# Patient Record
Sex: Male | Born: 1990
Health system: Southern US, Community
[De-identification: ages and names within clinical notes are randomized; demographics above are authoritative.]

## PROBLEM LIST (undated history)

## (undated) DIAGNOSIS — I10 Essential (primary) hypertension: Secondary | ICD-10-CM

## (undated) DIAGNOSIS — J45909 Unspecified asthma, uncomplicated: Secondary | ICD-10-CM

## (undated) HISTORY — PX: KNEE SURGERY: SHX244

---
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
## 2014-08-29 ENCOUNTER — Encounter (HOSPITAL_COMMUNITY): Payer: Self-pay | Admitting: Emergency Medicine

## 2014-08-29 ENCOUNTER — Emergency Department (HOSPITAL_COMMUNITY)
Admission: EM | Admit: 2014-08-29 | Discharge: 2014-08-29 | Disposition: A | Discharge status: Home / Self Care | Payer: 59 | Attending: Emergency Medicine | Admitting: Emergency Medicine

## 2014-08-29 ENCOUNTER — Emergency Department (HOSPITAL_COMMUNITY): Payer: 59

## 2014-08-29 DIAGNOSIS — M25562 Pain in left knee: Secondary | ICD-10-CM

## 2014-08-29 DIAGNOSIS — Z79899 Other long term (current) drug therapy: Secondary | ICD-10-CM | POA: Diagnosis not present

## 2014-08-29 DIAGNOSIS — S8992XA Unspecified injury of left lower leg, initial encounter: Secondary | ICD-10-CM | POA: Diagnosis present

## 2014-08-29 DIAGNOSIS — Y9289 Other specified places as the place of occurrence of the external cause: Secondary | ICD-10-CM | POA: Insufficient documentation

## 2014-08-29 DIAGNOSIS — Z9889 Other specified postprocedural states: Secondary | ICD-10-CM | POA: Insufficient documentation

## 2014-08-29 DIAGNOSIS — W1839XA Other fall on same level, initial encounter: Secondary | ICD-10-CM | POA: Diagnosis not present

## 2014-08-29 DIAGNOSIS — W19XXXA Unspecified fall, initial encounter: Secondary | ICD-10-CM

## 2014-08-29 DIAGNOSIS — Y998 Other external cause status: Secondary | ICD-10-CM | POA: Diagnosis not present

## 2014-08-29 DIAGNOSIS — Y9389 Activity, other specified: Secondary | ICD-10-CM | POA: Insufficient documentation

## 2014-08-29 MED ORDER — HYDROCODONE-ACETAMINOPHEN 5-325 MG PO TABS: 1.0000 | ORAL_TABLET | Freq: Four times a day (QID) | ORAL | Status: DC | PRN

## 2014-08-29 NOTE — ED Provider Notes (Signed)
CSN: [id number]     Arrival date & time 08/29/14  1330 History   First MD Initiated Contact with Patient 08/29/14 1352     This chart was scribed for non-physician practitioner, Raymon Mutton PA-C working with Rolland Porter, MD by Arlan Organ, ED Scribe. This patient was seen in room WTR6/WTR6 and the patient's care was started at 3:29 PM.   Chief Complaint  Patient presents with  . Knee Pain   The history is provided by the patient. No language interpreter was used.    HPI Comments: Cory Warren is a 24 y.o. male who presents to the Emergency Department complaining of constant, moderate  L knee pain with associated mild swelling x 1 day that has progressively worsened. Pain is described as sharp and pressure. Pt states he slipped on the ice yesterday resulting in him planting his foot to the ground and twisting his knee hearing a "pop". No head trauma or LOC at time of fall. Patient reported that he is able to bear small amount weight on the leg, reports that he hobbles when walking. Reported that he has applied ice to the area without any improvement for discomfort. He has also tried OTC Ibuprofen without any relief. Reported that he had a similar episode with his right knee that required surgery. Denied back pain, foot pain, neck pain, or ankle pain, numbness, tingling.  Pt with known allergy to Biaxin. Pt is currently followed by Dr. Kathreen Devoid, M.D. Orthopedist in [place], [place]  No past medical history on file. Past Surgical History  Procedure Laterality Date  . Knee surgery     No family history on file. History  Substance Use Topics  . Smoking status: Never Smoker   . Smokeless tobacco: Not on file  . Alcohol Use: No    Review of Systems  Musculoskeletal: Positive for joint swelling and arthralgias.  Neurological: Negative for weakness and numbness.      Allergies  Biaxin  Home Medications   Prior to Admission medications   Medication Sig Start Date  End Date Taking? Authorizing Provider  amphetamine-dextroamphetamine (ADDERALL) 20 MG tablet Take 20 mg by mouth 2 (two) times daily. 08/18/14  Yes Historical Provider, MD  ibuprofen (ADVIL,MOTRIN) 200 MG tablet Take 400 mg by mouth every 6 (six) hours as needed for moderate pain.   Yes Historical Provider, MD  VENTOLIN HFA 108 (90 BASE) MCG/ACT inhaler Inhale 2 puffs into the lungs every 4 (four) hours as needed for wheezing.  08/19/14  Yes Historical Provider, MD  HYDROcodone-acetaminophen (NORCO/VICODIN) 5-325 MG per tablet Take 1 tablet by mouth every 6 (six) hours as needed for moderate pain or severe pain. 08/29/14   Nakesha Ebrahim, PA-C   Triage Vitals: BP 136/96 mmHg  Pulse 97  Temp(Src) 98.6 F (37 C) (Oral)  Resp 16  SpO2 96%   Physical Exam  Constitutional: He is oriented to person, place, and time. He appears well-developed and well-nourished.  HENT:  Head: Normocephalic and atraumatic.  Eyes: Conjunctivae and EOM are normal. Right eye exhibits no discharge. Left eye exhibits no discharge.  Neck: Normal range of motion. Neck supple. No tracheal deviation present.  Cardiovascular: Normal rate, regular rhythm and normal heart sounds.  Exam reveals no friction rub.   No murmur heard. Pulses:      Radial pulses are 2+ on the right side, and 2+ on the left side.       Dorsalis pedis pulses are 2+ on the right side, and  2+ on the left side.  Pulmonary/Chest: Effort normal and breath sounds normal. No respiratory distress. He has no wheezes. He has no rales.  Abdominal: He exhibits no distension.  Musculoskeletal: He exhibits edema and tenderness.       Left knee: He exhibits decreased range of motion (secondary to pain), swelling and effusion. He exhibits no ecchymosis, no deformity, no laceration and no erythema. Tenderness found. Medial joint line and lateral joint line tenderness noted.       Legs: Moderate swelling identified to left knee circumferentially with negative  ecchymosis, erythema, inflammation, warmth upon palpation, red streaks. Negative findings of cellulitic infection. Pain upon palpation to the anterior aspects and medial lateral aspects of the knee. Appears to have a small joint effusion. Decreased range of motion secondary to pain-patient is able to flex and extend the left knee. Patient able to extend the left hip-negative findings of patellar tendon rupture. Full range of motion to left ankle. Full range of motion to the digits of the left foot without difficulty. Compartments are soft. Patient is able to wiggle toes without difficulty.  Lymphadenopathy:    He has no cervical adenopathy.  Neurological: He is alert and oriented to person, place, and time. No cranial nerve deficit. He exhibits normal muscle tone. Coordination normal.  Cranial nerves III-XII grossly intact Strength 5+/5+ to lower extremities bilaterally with resistance applied, equal distribution noted Strength intact to digits of the foot bilaterally Sensation intact with differentiation to sharp and dull touch DTR of left knee intact  Skin: Skin is warm and dry. No rash noted. No erythema.  Psychiatric: He has a normal mood and affect. His behavior is normal. Thought content normal.  Nursing note and vitals reviewed.   ED Course  Procedures (including critical care time)  DIAGNOSTIC STUDIES: Oxygen Saturation is 96% on RA, adequate by my interpretation.    COORDINATION OF CARE: 3:28 PM-Will order DG knee complete 4 views L. Discussed treatment plan with pt at bedside and pt agreed to plan.     Labs Review Labs Reviewed - No data to display  Imaging Review Dg Knee Complete 4 Views Left  08/29/2014   CLINICAL DATA:  Left knee pain post fall on ice today  EXAM: LEFT KNEE - COMPLETE 4+ VIEW  COMPARISON:  None.  FINDINGS: Four views of the left knee submitted. No acute fracture or subluxation. Small joint effusion. No radiopaque foreign body.  IMPRESSION: No acute fracture  or subluxation.  Small joint effusion.   Electronically Signed   By: Natasha MeadLiviu  Pop M.D.   On: 08/29/2014 14:36     EKG Interpretation None      MDM   Final diagnoses:  Left knee pain  Fall, initial encounter    Medications - No data to display  Filed Vitals:   08/29/14 1343  BP: 136/96  Pulse: 97  Temp: 98.6 F (37 C)  TempSrc: Oral  Resp: 16  SpO2: 96%   I personally performed the services described in this documentation, which was scribed in my presence. The recorded information has been reviewed and is accurate.  Plain film of left knee no acute fracture subluxation identified-small joint effusion noted. Patient presenting to the ED with what appears to be a ligament versus meniscal injury. Moderate swelling to the left knee identified with small joint effusion noted on x-ray. Patient is able to flex and extend the left knee, but does have pain. Negative crepitus upon palpation. Negative focal neurological deficits. Cap refill less  than 3 seconds. Pulses palpable and strong. Sensation intact. Strength intact. Doubt quadriceps tendon rupture. Doubt patellar tendon rupture. Doubt dislocation. Doubt compartment syndrome. Doubt septic joint. Patient stable, afebrile. Patient septic appearing. Patient placed in knee immobilizer with crutches. Discharge patient with pain medications-discussed course, cautions, disposal technique. Referred patient to PCP and orthopedics. Discussed with patient rest, ice, elevate. Discussed with patient to avoid any physical strenuous activity. Discussed with patient to closely monitor symptoms and if symptoms are to worsen or change to report back to the ED - strict return instructions given.  Patient agreed to plan of care, understood, all questions answered.   Raymon Mutton, PA-C 08/29/14 1557  Lyanne Co, MD 08/29/14 2209

## 2014-08-29 NOTE — ED Notes (Signed)
Per pt, states he fell in back yard last night and injured left knee-pain and swelling

## 2014-08-29 NOTE — ED Notes (Signed)
Ortho tech at bedside 

## 2014-08-29 NOTE — Discharge Instructions (Signed)
Please call your doctor for a followup appointment within 24-48 hours. When you talk to your doctor please let them know that you were seen in the emergency department and have them acquire all of your records so that they can discuss the findings with you and formulate a treatment plan to fully care for your new and ongoing problems. Please rest, ice, elevate-toes above nose Please keep knee immobilizer on at all times Please use crutches Please follow-up with orthopedics Please take medications as prescribed - while on pain medications there is to be no drinking alcohol, driving, operating any heavy machinery. If extra please dispose in a proper manner. Please do not take any extra Tylenol with this medication for this can lead to Tylenol overdose and liver issues.  Please continue to monitor symptoms closely and if symptoms are to worsen or change (fever greater than 101, chills, sweating, nausea, vomiting, chest pain, shortness of breathe, difficulty breathing, weakness, numbness, tingling, worsening or changes to pain pattern, increased swelling, fall, injury, red streaks, loss of sensation) please report back to the Emergency Department immediately.   Knee Sprain A knee sprain is a tear in one of the strong, fibrous tissues that connect the bones (ligaments) in your knee. The severity of the sprain depends on how much of the ligament is torn. The tear can be either partial or complete. CAUSES  Often, sprains are a result of a fall or injury. The force of the impact causes the fibers of your ligament to stretch too much. This excess tension causes the fibers of your ligament to tear. SIGNS AND SYMPTOMS  You may have some loss of motion in your knee. Other symptoms include:  Bruising.  Pain in the knee area.  Tenderness of the knee to the touch.  Swelling. DIAGNOSIS  To diagnose a knee sprain, your health care provider will physically examine your knee. Your health care provider may also  suggest an X-ray exam of your knee to make sure no bones are broken. TREATMENT  If your ligament is only partially torn, treatment usually involves keeping the knee in a fixed position (immobilization) or bracing your knee for activities that require movement for several weeks. To do this, your health care provider will apply a bandage, cast, or splint to keep your knee from moving and to support your knee during movement until it heals. For a partially torn ligament, the healing process usually takes 4-6 weeks. If your ligament is completely torn, depending on which ligament it is, you may need surgery to reconnect the ligament to the bone or reconstruct it. After surgery, a cast or splint may be applied and will need to stay on your knee for 4-6 weeks while your ligament heals. HOME CARE INSTRUCTIONS  Keep your injured knee elevated to decrease swelling.  To ease pain and swelling, apply ice to the injured area:  Put ice in a plastic bag.  Place a towel between your skin and the bag.  Leave the ice on for 20 minutes, 2-3 times a day.  Only take medicine for pain as directed by your health care provider.  Do not leave your knee unprotected until pain and stiffness go away (usually 4-6 weeks).  If you have a cast or splint, do not allow it to get wet. If you have been instructed not to remove it, cover it with a plastic bag when you shower or bathe. Do not swim.  Your health care provider may suggest exercises for you to do  during your recovery to prevent or limit permanent weakness and stiffness. SEEK IMMEDIATE MEDICAL CARE IF:  Your cast or splint becomes damaged.  Your pain becomes worse.  You have significant pain, swelling, or numbness below the cast or splint. MAKE SURE YOU:  Understand these instructions.  Will watch your condition.  Will get help right away if you are not doing well or get worse. Document Released: 07/24/2005 Document Revised: 05/14/2013 Document  Reviewed: 03/05/2013 [facility] Patient Information 2015 [place], [place]. This information is not intended to replace advice given to you by your health care provider. Make sure you discuss any questions you have with your health care provider.   Emergency Department Resource Guide 1) Find a Doctor and Pay Out of Pocket Although you won't have to find out who is covered by your insurance plan, it is a good idea to ask around and get recommendations. You will then need to call the office and see if the doctor you have chosen will accept you as a new patient and what types of options they offer for patients who are self-pay. Some doctors offer discounts or will set up payment plans for their patients who do not have insurance, but you will need to ask so you aren't surprised when you get to your appointment.  2) Contact Your Local Health Department Not all health departments have doctors that can see patients for sick visits, but many do, so it is worth a call to see if yours does. If you don't know where your local health department is, you can check in your phone book. The CDC also has a tool to help you locate your state's health department, and many state websites also have listings of all of their local health departments.  3) Find a Walk-in Clinic If your illness is not likely to be very severe or complicated, you may want to try a walk in clinic. These are popping up all over the country in pharmacies, drugstores, and shopping centers. They're usually staffed by nurse practitioners or physician assistants that have been trained to treat common illnesses and complaints. They're usually fairly quick and inexpensive. However, if you have serious medical issues or chronic medical problems, these are probably not your best option.  No Primary Care Doctor: - Call Health Connect at  [phone] - they can help you locate a primary care doctor that  accepts your insurance, provides certain services,  etc. - Physician Referral Service- [phone]  Chronic Pain Problems: Organization         Address  Phone   Notes  [facility]  [phone] Patients need to be referred by their primary care doctor.   Medication Assistance: Organization         Address  Phone   Notes  [facility] Medication [facility] [address] [place] [place], [place] [address] [phone] --Must be a resident of [facility] -- Must have NO insurance coverage whatsoever (no Medicaid/ Medicare, etc.) -- The pt. MUST have a primary care doctor that directs their care regularly and follows them in the community   [facility]ist  [phone]   [employer]  [phone]    Agencies that provide inexpensive medical care: Organization         Address  Phone   Notes  [facility]  [phone]   [facility]    [phone]   [facility] [address]  [place], [place] [address] [phone]   [facility] [address] [place], [place] [phone]   Planned Parenthood    [phone]   [facility]    [phone]   [facility]  [address], [place] Phone:  [phone], Fax:  [phone] Hours of Operation:  9 am - 6 pm, M-F.  Also accepts Medicaid/Medicare and self-pay.  [facility]  [address], [place] Phone: [phone], Fax: [phone]. Hours of Operation:  8:30 am - 5:30 pm, M-F.  Also accepts Medicaid and self-pay.  [facility] [place] [address] [place] Phone: [phone]   [facility] [address] Natasha Bence [place], [place] [phone], Ext. [phone] Mondays & Thursdays: 7-9 AM.  First 15 patients are seen on a first come, first serve basis.    Medicaid-accepting [facility] Providers:  Organization         Address  Phone   Notes  [facility] [address], [place]  [phone] Also accepts self-pay patients.  [facility] [address] Laurell Josephs [place], [place]  [phone]   [facility] [address], [place] [phone]   [facility] [address] [place] [phone]   Renaye Rakers [address], [place]   [phone] Only accepts [place] patients after they have their name applied to their card.   Self-Pay (no insurance) in [facility]:  Organization         Address  Phone   Notes  Sickle Cell Patients, [facility] Internal Medicine [address] [place], [place] [phone]   [facility] [address] [place], [place] [phone]   [facility] [place]  [address], [place] [phone]   [facility]/Dr. Osei-Bonsu  [address], [place] or [address], [place] [phone] Phone number for both [place] and [place] locations is the same.  [facility] and [facility] [address], [place] [phone]   [facility] [address], [place] or [address] [phone]   [facility] [address] [place] [phone], phone; [phone], fax Sees patients 1st and 3rd Saturday of every month.  Must not qualify for public or private insurance (i.e. Medicaid, Medicare, [place] Health Choice, Veterans' Benefits)  Household income should be no more than 200% of the poverty level The clinic cannot treat you if you are pregnant or think you are pregnant  Sexually transmitted diseases are not treated at the clinic.    Dental Care: Organization         Address  Phone  Notes  [facility] Department of [facility] [address] [place], [place] [phone] Accepts children up to  age 28 who are enrolled in [place] or [place] Health Choice; pregnant women with a Medicaid card; and children who have applied for Medicaid or [place] Health Choice, but were declined, whose parents can pay a reduced fee at time of service.  [facility] Department of [facility]  [address], [place] [phone] Accepts children up to age 72 who are enrolled in [place] or [place] Health Choice;  pregnant women with a Medicaid card; and children who have applied for Medicaid or [place] Health Choice, but were declined, whose parents can pay a reduced fee at time of service.  [facility]  [address] [place], [place] [phone] Patients are seen by appointment only. Walk-ins are not accepted. [facility] will see patients 65 years of age and older. Monday - Tuesday (8am-5pm) Most Wednesdays (8:30-5pm) $30 per visit, cash only  [facility] Adult Dental Access [facility]  [address], [facility] [phone] Patients are seen by appointment only. Walk-ins are not accepted. [facility] will see patients 56 years of age and older. One Wednesday Evening (Monthly: Volunteer Based).  $30 per visit, cash only  [employer]  [phone] for adults; Children under age 60, call Graduate Pediatric Dentistry at [phone]. Children aged 2-14, please call [phone] to request a pediatric application.  Dental services are provided in all areas of dental care including fillings, crowns and bridges, complete and partial dentures, implants, gum treatment, root canals, and extractions. Preventive care is also provided. Treatment is provided to both adults and children. Patients are selected via a lottery and there is often a waiting list.   [facility] [address] [place]  [phone] [email or web address]   [facility] [address] [place], [place] [phone], Ext. [phone] Second and Fourth Thursday of  each month, opens at 6:30 AM; Clinic ends at 9 AM.  Patients are seen on a first-come first-served basis, and a limited number are seen during each clinic.   [facility]  [address] Ether Griffins [place], [place] [phone]   Eligibility Requirements You must have lived in [place], [place], or [place] counties for at least the last three months.   You cannot be eligible for state or federal sponsored [place], including [employer], [place], or [employer].   You generally cannot be eligible for healthcare insurance through your employer.    How to apply: Eligibility screenings are held every Tuesday and Wednesday afternoon from 1:00 pm until 4:00 pm. You do not need an appointment for the interview!  [facility] [address] [place], [place] [phone]   [facility] Health Department  [phone]   [facility] Health Department  [phone]   [facility] Health Department  [phone]    Behavioral Health Resources in the Community: Intensive Outpatient Programs Organization         Address  Phone  Notes  [facility] Services [address], [place], [place] [phone]   [facility] Outpatient [address], [place], [place] [phone]   [facility] [address], [place], [place]  [phone]   [facility] [address],  [place], [place] [phone] or [phone]   Substance Abuse Resources Organization         Address  Phone  Notes  [facility] Drug Services  [phone]   [facility]  [phone]   [facility]  [phone]   Floydene Flock  [phone]   Residential & Outpatient Substance Abuse Program  [phone]   Psychological Services Organization         Address  Phone  Notes  [facility] Behavioral Health  [phone]   [facility] Services  [phone]   [facility] Mental Health [address]  [place] [phone] or [phone]    Mobile  Crisis Teams Organization         Address  Phone  Notes  [facility], Mobile Crisis Care Unit  [phone]   [facility]  [address] [place], [place] [phone]   [facility] [address] [place] [phone]    Self-Help/Support Groups Organization         Address  Phone             Notes  [facility] [place] - variety of support groups  336- [phone] Call for more information  Narcotics Anonymous (NA), [facility] [address], [place]  2 meetings at this location   Statisticianesidential Treatment Programs Organization         Address  Phone  Notes  [facility] [address],    [place]  [phone]   [facility]  [address], [place] [phone], [place], [place] [phone]   [facility] [address], [place] [phone] Admissions: 8am-3pm M-F  [facility] [address]    [place], [place] [phone]   [facility] [address], [place], [place] [phone]   [facility] [address]  [place], [place] [phone]   [facility] - Intensive Outpatient [address], [place], [place] [phone]   [facility] ([facility] [address]  [place], [place] [phone] or [phone]   [facility] ([facility]) [address] [place], [place] [phone] Accepts Medicaid  [facility] [address]  [place] [phone] Substance Abuse/Addiction Treatment   [facility] Behavioral Health Resources Organization         Address  Phone  Notes  [facility]  [phone](888) [phone]   Angie FavaJulie Brannon, PhD [address] [place], [place]   [phone]) [phone] or [phone]   [facility]   [address] [place], [place] [phone](336) [phone]     [facility] [address], [place], [place] [phone](336) [phone] Insurance/Medicaid/sponsorship through [facility]  [facility] [address]                                    [place], [place] [phone](336) [phone] Therapy/tele-psych/case  [facility] [address]   [place], [place] [phone]) [phone]    Dr. Lolly MustacheArfeen  [phone]) [phone]   [facility] [place] [facility]  [employer] [facility] 1) [address], [place] 2) [address], [place] 3)  [address], [place] [phone]  [phone](336) [phone]   [facility] Child Abuse Hotline [phone](336) [phone] or [phone]) [phone] (After Hours)

## 2015-07-21 ENCOUNTER — Encounter: Payer: Self-pay | Admitting: Family Medicine

## 2015-07-21 ENCOUNTER — Ambulatory Visit (INDEPENDENT_AMBULATORY_CARE_PROVIDER_SITE_OTHER): Payer: 59 | Admitting: Family Medicine

## 2015-07-21 VITALS — BP 148/80 | HR 96 | Temp 98.7°F | Resp 16 | Wt 306.0 lb

## 2015-07-21 DIAGNOSIS — F909 Attention-deficit hyperactivity disorder, unspecified type: Secondary | ICD-10-CM | POA: Diagnosis not present

## 2015-07-21 DIAGNOSIS — F988 Other specified behavioral and emotional disorders with onset usually occurring in childhood and adolescence: Secondary | ICD-10-CM

## 2015-07-21 DIAGNOSIS — J45909 Unspecified asthma, uncomplicated: Secondary | ICD-10-CM

## 2015-07-21 DIAGNOSIS — E669 Obesity, unspecified: Secondary | ICD-10-CM | POA: Insufficient documentation

## 2015-07-21 DIAGNOSIS — Z789 Other specified health status: Secondary | ICD-10-CM | POA: Insufficient documentation

## 2015-07-21 DIAGNOSIS — R05 Cough: Secondary | ICD-10-CM | POA: Diagnosis not present

## 2015-07-21 DIAGNOSIS — G43909 Migraine, unspecified, not intractable, without status migrainosus: Secondary | ICD-10-CM | POA: Insufficient documentation

## 2015-07-21 DIAGNOSIS — R059 Cough, unspecified: Secondary | ICD-10-CM

## 2015-07-21 MED ORDER — HYDROCODONE-HOMATROPINE 5-1.5 MG/5ML PO SYRP
5.0000 mL | ORAL_SOLUTION | Freq: Three times a day (TID) | ORAL | Status: DC | PRN
Start: 2015-07-21 — End: 2015-07-28

## 2015-07-21 MED ORDER — AMPHETAMINE-DEXTROAMPHETAMINE 20 MG PO TABS: 20.0000 mg | ORAL_TABLET | Freq: Two times a day (BID) | ORAL | Status: DC

## 2015-07-21 MED ORDER — AZITHROMYCIN 250 MG PO TABS: ORAL_TABLET | ORAL | Status: DC

## 2015-07-21 NOTE — Progress Notes (Signed)
Patient ID: Cory Warren, male   DOB: [birth year], 24 y.o.   MRN: [id number]    Subjective:  HPI Pt reports that for about 4 days now he has been coughing, congested, has a sore throat, shortness of breath and wheezing. He reports that he has also had some body aches, no fevers or chills. He is coughing up yellow, green sputum. He reports that he is sore from coughing a lot. He reports that the last 2 days have been worse that the first couple of days it started. The cough is keeping him up at night as well.   Pt also needs a refill on his Adderall. He reports that he is doing well on this. However he would like to discuss the ER. He was on it before and was changed to regular release.   Prior to Admission medications   Medication Sig Start Date End Date Taking? Authorizing Provider  amphetamine-dextroamphetamine (ADDERALL) 20 MG tablet Take 20 mg by mouth 2 (two) times daily. 08/18/14  Yes Historical Provider, MD  VENTOLIN HFA 108 (90 BASE) MCG/ACT inhaler Inhale 2 puffs into the lungs every 4 (four) hours as needed for wheezing.  08/19/14  Yes Historical Provider, MD    Patient Active Problem List   Diagnosis Date Noted  . ADD (attention deficit disorder) 07/21/2015  . Problems influencing health status 07/21/2015  . Airway hyperreactivity 07/21/2015  . Adiposity 07/21/2015  . Migraine 07/21/2015    History reviewed. No pertinent past medical history.  Social History   Social History  . Marital Status: Single    Spouse Name: N/A  . Number of Children: N/A  . Years of Education: N/A   Occupational History  . Not on file.   Social History Main Topics  . Smoking status: Never Smoker   . Smokeless tobacco: Not on file  . Alcohol Use: Yes     Comment: occasionally  . Drug Use: No  . Sexual Activity: Not on file   Other Topics Concern  . Not on file   Social History Narrative    Allergies  Allergen Reactions  . Biaxin [Clarithromycin] Other (See Comments)   Happen when was a child    Review of Systems  Constitutional: Positive for malaise/fatigue.  HENT: Positive for congestion and sore throat.   Eyes: Negative.   Respiratory: Positive for cough, sputum production (yellow/green), shortness of breath and wheezing.   Cardiovascular: Negative.   Gastrointestinal: Negative.   Genitourinary: Negative.   Musculoskeletal: Positive for myalgias.  Skin: Negative.   Neurological: Positive for headaches (sinus pain and pressure).  Endo/Heme/Allergies: Negative.   Psychiatric/Behavioral: Negative.     Immunization History  Administered Date(s) Administered  . Hepatitis B 1991-06-30, 06/23/1993, 10/20/1993  . MMR 08/03/1994, 02/14/1999  . Td 03/26/2008  . Tdap 03/26/2008   Objective:  BP 148/80 mmHg  Pulse 96  Temp(Src) 98.7 F (37.1 C) (Oral)  Resp 16  Wt 306 lb (138.801 kg)  Physical Exam  Constitutional: He is oriented to person, place, and time and well-developed, well-nourished, and in no distress.  HENT:  Head: Normocephalic and atraumatic.  Right Ear: External ear normal.  Left Ear: External ear normal.  Nose: Nose normal.  Mouth/Throat: Oropharynx is clear and moist.  Eyes: Conjunctivae and EOM are normal. Pupils are equal, round, and reactive to light.  Neck: Normal range of motion. Neck supple.  Cardiovascular: Normal rate, regular rhythm, normal heart sounds and intact distal pulses.   Pulmonary/Chest: Effort normal and breath sounds  normal.  Abdominal: Soft. Bowel sounds are normal.  Musculoskeletal: Normal range of motion.  Neurological: He is alert and oriented to person, place, and time. He has normal reflexes. Gait normal. GCS score is 15.  Skin: Skin is warm and dry.  Psychiatric: Mood, memory, affect and judgment normal.    No results found for: WBC, HGB, HCT, PLT, GLUCOSE, CHOL, TRIG, HDL, LDLDIRECT, LDLCALC, TSH, PSA, INR, GLUF, HGBA1C, MICROALBUR  CMP  No results found for: NA, K, CL, CO2, GLUCOSE, BUN,  CREATININE, CALCIUM, PROT, ALBUMIN, AST, ALT, ALKPHOS, BILITOT, GFRNONAA, GFRAA  Assessment and Plan :  1. ADD (attention deficit disorder) Refills times 3.  - amphetamine-dextroamphetamine (ADDERALL) 20 MG tablet; Take 1 tablet (20 mg total) by mouth 2 (two) times daily.  Dispense: 60 tablet; Refill: 0  2. Asthmatic bronchitis, unspecified asthma severity, uncomplicated  - azithromycin (ZITHROMAX Z-PAK) 250 MG tablet; Take 2 now then once daily until finished.  Dispense: 6 each; Refill: 0  3. Cough  - HYDROcodone-homatropine (HYCODAN) 5-1.5 MG/5ML syrup; Take 5 mLs by mouth every 8 (eight) hours as needed for cough.  Dispense: 120 mL; Refill: 0  Patient was seen and examined by Dr. Miguel Aschoff, and noted scribed by Webb Laws, [place] MD [facility] 07/21/2015 4:13 PM

## 2015-07-28 ENCOUNTER — Ambulatory Visit (INDEPENDENT_AMBULATORY_CARE_PROVIDER_SITE_OTHER): Payer: 59 | Admitting: Family Medicine

## 2015-07-28 ENCOUNTER — Encounter: Payer: Self-pay | Admitting: Family Medicine

## 2015-07-28 VITALS — BP 144/72 | HR 102 | Temp 98.1°F | Resp 16 | Wt 308.0 lb

## 2015-07-28 DIAGNOSIS — B370 Candidal stomatitis: Secondary | ICD-10-CM

## 2015-07-28 DIAGNOSIS — J029 Acute pharyngitis, unspecified: Secondary | ICD-10-CM

## 2015-07-28 LAB — POCT RAPID STREP A (OFFICE): RAPID STREP A SCREEN: NEGATIVE

## 2015-07-28 MED ORDER — NYSTATIN 100000 UNIT/ML MT SUSP: 5.0000 mL | Freq: Four times a day (QID) | OROMUCOSAL | Status: DC

## 2015-07-28 MED ORDER — AMOXICILLIN 500 MG PO CAPS: 1000.0000 mg | ORAL_CAPSULE | Freq: Two times a day (BID) | ORAL | Status: DC

## 2015-07-28 NOTE — Progress Notes (Signed)
Patient ID: Cory Warren, male   DOB: 26-Mar-1991, 24 y.o.   MRN: [id number]    Subjective:  HPI Pt is here today for a sore throat. He was in the office about a week ago for bronchitis. He reports that he getting better with that, but his throat is very sore. It hurts to swallow liquids and solids and to swallow at all. He does not feel bad at all. He was treated before with a Zpak and cough syrup. It reports that his throat started hurting about 3 days ago. " It was like I woke up and felt better from the bronchitis but then my throat hurt really bad"  Prior to Admission medications   Medication Sig Start Date End Date Taking? Authorizing Provider  amphetamine-dextroamphetamine (ADDERALL) 20 MG tablet Take 1 tablet (20 mg total) by mouth 2 (two) times daily. 07/21/15  Yes Richard Maceo Pro., MD  VENTOLIN HFA 108 (90 BASE) MCG/ACT inhaler Inhale 2 puffs into the lungs every 4 (four) hours as needed for wheezing.  08/19/14  Yes Historical Provider, MD    Patient Active Problem List   Diagnosis Date Noted  . ADD (attention deficit disorder) 07/21/2015  . Problems influencing health status 07/21/2015  . Airway hyperreactivity 07/21/2015  . Adiposity 07/21/2015  . Migraine 07/21/2015    History reviewed. No pertinent past medical history.  Social History   Social History  . Marital Status: Single    Spouse Name: N/A  . Number of Children: N/A  . Years of Education: N/A   Occupational History  . Not on file.   Social History Main Topics  . Smoking status: Never Smoker   . Smokeless tobacco: Not on file  . Alcohol Use: Yes     Comment: occasionally  . Drug Use: No  . Sexual Activity: Not on file   Other Topics Concern  . Not on file   Social History Narrative    Allergies  Allergen Reactions  . Biaxin [Clarithromycin] Other (See Comments)    Happen when was a child    Review of Systems  Constitutional: Negative.   HENT: Positive for sore throat.   Eyes:  Negative.   Respiratory: Negative.   Cardiovascular: Negative.   Gastrointestinal: Negative.   Genitourinary: Negative.   Musculoskeletal: Negative.   Skin: Negative.   Neurological: Negative.   Endo/Heme/Allergies: Negative.   Psychiatric/Behavioral: Negative.     Immunization History  Administered Date(s) Administered  . Hepatitis B Jan 19, 1993, 06/23/1993, 10/20/1993  . MMR 08/03/1994, 02/14/1999  . Td 03/26/2008  . Tdap 03/26/2008   Objective:  BP 144/72 mmHg  Pulse 102  Temp(Src) 98.1 F (36.7 C) (Oral)  Resp 16  Wt 308 lb (139.708 kg)  Physical Exam  Constitutional: He is oriented to person, place, and time and well-developed, well-nourished, and in no distress.  HENT:  Head: Normocephalic and atraumatic.  Right Ear: External ear normal.  Left Ear: External ear normal.  Nose: Nose normal.  Mouth/Throat: Oropharynx is clear and moist.  Eyes: Conjunctivae and EOM are normal. Pupils are equal, round, and reactive to light.  Neck: Normal range of motion. Neck supple.  Cardiovascular: Normal rate, regular rhythm, normal heart sounds and intact distal pulses.   Pulmonary/Chest: Effort normal and breath sounds normal.  Neurological: He is alert and oriented to person, place, and time. He has normal reflexes. Gait normal. GCS score is 15.  Skin: Skin is warm and dry.  Psychiatric: Mood, memory, affect and judgment normal.  No results found for: WBC, HGB, HCT, PLT, GLUCOSE, CHOL, TRIG, HDL, LDLDIRECT, LDLCALC, TSH, PSA, INR, GLUF, HGBA1C, MICROALBUR  CMP  No results found for: NA, K, CL, CO2, GLUCOSE, BUN, CREATININE, CALCIUM, PROT, ALBUMIN, AST, ALT, ALKPHOS, BILITOT, GFRNONAA, GFRAA  Assessment and Plan :  1. Sore throat  - POCT rapid strep A- Negative  2. Pharyngitis  - amoxicillin (AMOXIL) 500 MG capsule; Take 2 capsules (1,000 mg total) by mouth 2 (two) times daily.  Dispense: 28 capsule; Refill: 0 Discussed remote possibility of mononycleosis. 3.  Thrush Possibly thrush, will treat increase the abx do not help.  - nystatin (MYCOSTATIN) 100000 UNIT/ML suspension; Use as directed 5 mLs (500,000 Units total) in the mouth or throat 4 (four) times daily. Swish and spit  Dispense: 120 mL; Refill: 0 I have done the exam and reviewed the above chart and it is accurate to the best of my knowledge.  Patient was seen and examined by Dr. Miguel Aschoff, and noted scribed by Webb Laws, [place] MD [facility] 07/28/2015 2:04 PM

## 2015-07-30 ENCOUNTER — Ambulatory Visit (INDEPENDENT_AMBULATORY_CARE_PROVIDER_SITE_OTHER): Payer: 59 | Admitting: Family Medicine

## 2015-07-30 ENCOUNTER — Encounter: Payer: Self-pay | Admitting: Family Medicine

## 2015-07-30 VITALS — BP 142/76 | HR 112 | Temp 98.3°F | Resp 16 | Wt 310.2 lb

## 2015-07-30 DIAGNOSIS — K120 Recurrent oral aphthae: Secondary | ICD-10-CM | POA: Diagnosis not present

## 2015-07-30 NOTE — Patient Instructions (Signed)
Discussed trying MVLB mixture 5-10 ml. Before meals and at bedtime. If not improving in a week or so call for ENT referral.

## 2015-07-30 NOTE — Progress Notes (Signed)
Subjective:     Patient ID: Cory Warren, male   DOB: 12-16-1990, 24 y.o.   MRN: [id number]  HPI  Chief Complaint  Patient presents with  . Sore Throat    Patient returns to clinic for follow up, last visit was 07/28/15 in house strep was negative. Patient was prescribed Amoxicillin 500mg  and to rule out possible thrush he was prescribed Nystatin 1000unit/mL, patient was instructed to return to clinic if symptoms did not start to improve. Patient states today that pain is worse in his throat.   States that except for his throat he feels ok with no sinus congestion or cough. Denies heartburn or reflux. Unsure whether he has had mono.   Review of Systems  Constitutional: Negative for fever, chills and fatigue.       Objective:   Physical Exam  Constitutional: He appears well-developed and well-nourished. No distress.  HENT:  Tonsils mildly enlarged and mildly erythematous. Appears to have an ulcer on his right tonsil.  Lymphadenopathy:    He has no cervical adenopathy.       Assessment:    1. Ulcer aphthous oral    Plan:    Rx for MVLB mixture.Consider ENT referral if not improving next week.

## 2015-10-20 ENCOUNTER — Ambulatory Visit: Payer: 59 | Admitting: Family Medicine

## 2015-10-21 ENCOUNTER — Ambulatory Visit (INDEPENDENT_AMBULATORY_CARE_PROVIDER_SITE_OTHER): Payer: 59 | Admitting: Family Medicine

## 2015-10-21 VITALS — BP 136/94 | HR 103 | Temp 98.1°F | Resp 16 | Wt 310.0 lb

## 2015-10-21 DIAGNOSIS — F909 Attention-deficit hyperactivity disorder, unspecified type: Secondary | ICD-10-CM

## 2015-10-21 DIAGNOSIS — J45909 Unspecified asthma, uncomplicated: Secondary | ICD-10-CM | POA: Diagnosis not present

## 2015-10-21 DIAGNOSIS — R05 Cough: Secondary | ICD-10-CM

## 2015-10-21 DIAGNOSIS — F988 Other specified behavioral and emotional disorders with onset usually occurring in childhood and adolescence: Secondary | ICD-10-CM

## 2015-10-21 DIAGNOSIS — R059 Cough, unspecified: Secondary | ICD-10-CM

## 2015-10-21 MED ORDER — LEVALBUTEROL HCL 1.25 MG/0.5ML IN NEBU: 1.2500 mg | INHALATION_SOLUTION | Freq: Once | RESPIRATORY_TRACT | Status: DC

## 2015-10-21 MED ORDER — PREDNISONE 10 MG (21) PO TBPK: 10.0000 mg | ORAL_TABLET | Freq: Every day | ORAL | Status: DC

## 2015-10-21 MED ORDER — AMOXICILLIN-POT CLAVULANATE 875-125 MG PO TABS: 1.0000 | ORAL_TABLET | Freq: Two times a day (BID) | ORAL | Status: DC

## 2015-10-21 MED ORDER — AMPHETAMINE-DEXTROAMPHETAMINE 20 MG PO TABS: 20.0000 mg | ORAL_TABLET | Freq: Two times a day (BID) | ORAL | Status: DC

## 2015-10-21 MED ORDER — HYDROCOD POLST-CPM POLST ER 10-8 MG/5ML PO SUER: 5.0000 mL | Freq: Two times a day (BID) | ORAL | Status: DC | PRN

## 2015-10-21 NOTE — Progress Notes (Signed)
Patient ID: Cory Warren, male   DOB: 14-Jun-1991, 25 y.o.   MRN: [id number]    Subjective:  HPI  Patient has not felt good for almost 7 days now. Cough-slightly productive with green/yellow phlegm, shortness of breath, chest congestion, nasal congestion, runny nose, post nasal drainage, irritated throat from coughing a lot. Rib pain from coughing so much. He has used Ventolin inhaler but does not feel like it is helping as it should, he inhales 3 to 4 puffs to feel any effect. He has taking NyQUil, DayQuil and Robitussin for his symptoms.  Prior to Admission medications   Medication Sig Start Date End Date Taking? Authorizing Provider  amphetamine-dextroamphetamine (ADDERALL) 20 MG tablet Take 1 tablet (20 mg total) by mouth 2 (two) times daily. 07/21/15  Yes Izela Altier Maceo Pro., MD  VENTOLIN HFA 108 (90 BASE) MCG/ACT inhaler Inhale 2 puffs into the lungs every 4 (four) hours as needed for wheezing.  08/19/14  Yes Historical Provider, MD    Patient Active Problem List   Diagnosis Date Noted  . ADD (attention deficit disorder) 07/21/2015  . Problems influencing health status 07/21/2015  . Airway hyperreactivity 07/21/2015  . Adiposity 07/21/2015  . Migraine 07/21/2015    No past medical history on file.  Social History   Social History  . Marital Status: Single    Spouse Name: N/A  . Number of Children: N/A  . Years of Education: N/A   Occupational History  . Not on file.   Social History Main Topics  . Smoking status: Never Smoker   . Smokeless tobacco: Not on file  . Alcohol Use: Yes     Comment: occasionally  . Drug Use: No  . Sexual Activity: Not on file   Other Topics Concern  . Not on file   Social History Narrative    Allergies  Allergen Reactions  . Biaxin [Clarithromycin] Other (See Comments)    Happen when was a child    Review of Systems  Constitutional: Positive for malaise/fatigue.  HENT: Positive for congestion and sore throat.     Respiratory: Positive for cough, sputum production and shortness of breath.   Cardiovascular: Negative.   Musculoskeletal: Positive for myalgias.    Immunization History  Administered Date(s) Administered  . Hepatitis B Feb 09, 1993, 06/23/1993, 10/20/1993  . MMR 08/03/1994, 02/14/1999  . Td 03/26/2008  . Tdap 03/26/2008   Objective:  BP 136/94 mmHg  Pulse 103  Temp(Src) 98.1 F (36.7 C)  Resp 16  Wt 310 lb (140.615 kg)  SpO2 97%  Physical Exam  Constitutional: He is oriented to person, place, and time and well-developed, well-nourished, and in no distress.  HENT:  Head: Normocephalic and atraumatic.  Right Ear: External ear normal.  Left Ear: External ear normal.  Mouth/Throat: Oropharynx is clear and moist.  Eyes: Conjunctivae are normal.  Neck: Neck supple.  Cardiovascular: Normal rate, regular rhythm and normal heart sounds.   Pulmonary/Chest: Effort normal. No respiratory distress. He has wheezes. He exhibits no tenderness.  Diffuse inspiratory and expiratory wheezes present. Wheezing did improve after nebulizer therapy  Abdominal: Soft.  Neurological: He is alert and oriented to person, place, and time.  Skin: Skin is warm and dry.  Psychiatric: Mood, memory, affect and judgment normal.      Assessment and Plan :  1. Asthmatic bronchitis, unspecified asthma severity, uncomplicated Xopenex nebulizer treatment done in the office today. Will treat and provide Breo to use on daily basis (sample), if this helps patient will  provide RX to have on hand for asthma. Nebulizer treatment in the office did help clear up his lungs. Do not think Chest Xray is needed at this time. Follow as needed. - levalbuterol (XOPENEX) nebulizer solution 1.25 mg; Take 1.25 mg by nebulization once. - chlorpheniramine-HYDROcodone (TUSSIONEX PENNKINETIC ER) 10-8 MG/5ML SUER; Take 5 mLs by mouth every 12 (twelve) hours as needed for cough.  Dispense: 140 mL; Refill: 0 - amoxicillin-clavulanate  (AUGMENTIN) 875-125 MG tablet; Take 1 tablet by mouth 2 (two) times daily.  Dispense: 20 tablet; Refill: 0  2. Cough RX for Tussionex provided.  3. ADD (attention deficit disorder) Stable on current treatment, refill given x 3. - amphetamine-dextroamphetamine (ADDERALL) 20 MG tablet; Take 1 tablet (20 mg total) by mouth 2 (two) times daily.  Dispense: 60 tablet; Refill: 0 - amphetamine-dextroamphetamine (ADDERALL) 20 MG tablet; Take 1 tablet (20 mg total) by mouth 2 (two) times daily.  Dispense: 60 tablet; Refill: 0 4. Obesity Patient was seen and examined by Dr. Eulas Post and note was scribed by Theressa Millard, RMA.   Miguel Aschoff MD [facility] 10/21/2015 3:50 PM

## 2015-10-22 ENCOUNTER — Telehealth: Payer: Self-pay

## 2015-10-22 NOTE — Telephone Encounter (Signed)
Medication approved through 10/21/16, notified pharmacy by faxed and asked them to let patient know.-aa

## 2015-10-22 NOTE — Telephone Encounter (Signed)
Fax from pharmacy stating Adderall needs PA, called and waiting for response from insurance-aa

## 2015-10-22 NOTE — Telephone Encounter (Signed)
Received response but it was denied, after reviewing the reason for denial found out that one of the questions was not asked when i spoke to a representative, I called back and another representative spoke with me and re submitted information, hopefully this will be done correctly this time, awaiting response-aa

## 2015-10-26 ENCOUNTER — Telehealth: Payer: Self-pay | Admitting: Family Medicine

## 2015-10-26 NOTE — Telephone Encounter (Signed)
Mom aware Adderall was approved till march of 2018. Advised to check with pharmacy-aa

## 2015-10-26 NOTE — Telephone Encounter (Signed)
Pt's mom has questions about his adderall.  She said the insurance rejected his prescription.  [facility] and hasn't heard back.  Thanks Barth Kirkseri  [phone]

## 2015-11-30 ENCOUNTER — Other Ambulatory Visit: Payer: Self-pay | Admitting: Family Medicine

## 2016-02-04 ENCOUNTER — Encounter: Payer: Self-pay | Admitting: Family Medicine

## 2016-02-04 ENCOUNTER — Ambulatory Visit (INDEPENDENT_AMBULATORY_CARE_PROVIDER_SITE_OTHER): Payer: 59 | Admitting: Family Medicine

## 2016-02-04 VITALS — BP 140/62 | HR 93 | Temp 99.4°F | Resp 16 | Wt 319.0 lb

## 2016-02-04 DIAGNOSIS — J4530 Mild persistent asthma, uncomplicated: Secondary | ICD-10-CM | POA: Diagnosis not present

## 2016-02-04 MED ORDER — BUDESONIDE-FORMOTEROL FUMARATE 80-4.5 MCG/ACT IN AERO: 2.0000 | INHALATION_SPRAY | Freq: Two times a day (BID) | RESPIRATORY_TRACT | Status: DC

## 2016-02-04 MED ORDER — MONTELUKAST SODIUM 10 MG PO TABS: 10.0000 mg | ORAL_TABLET | Freq: Every day | ORAL | Status: DC

## 2016-02-04 NOTE — Progress Notes (Signed)
Subjective:     Patient ID: Cory SchatzMatthew R Warren, male   DOB: [birth year], 25 y.o.   MRN: [id number]  HPI  Chief Complaint  Patient presents with  . Asthma  States he is using his Ventolin twice daily and wishes maintenance medication. Reports good response to [facility] provided at prior office visit.   Review of Systems     Objective:   Physical Exam  Constitutional: He appears well-developed and well-nourished. No distress.  Pulmonary/Chest: Breath sounds normal. No respiratory distress.       Assessment:    1. Airway hyperreactivity, mild persistent, uncomplicated: EMR query could not find maintenance inhaler covered by his insurance. Provided with rx and rx discount card for Symbicort. Singulair was covered and he will try that first. - budesonide-formoterol (SYMBICORT) 80-4.5 MCG/ACT inhaler; Inhale 2 puffs into the lungs 2 (two) times daily.  Dispense: 1 Inhaler; Refill: 12 - montelukast (SINGULAIR) 10 MG tablet; Take 1 tablet (10 mg total) by mouth at bedtime.  Dispense: 30 tablet; Refill: 5    Plan:    Further f/u as needed and per f/u with primary M.D., Dr. Sullivan LoneGilbert.

## 2016-02-04 NOTE — Patient Instructions (Signed)
It will take a few days to get full response to the medication.

## 2016-05-17 ENCOUNTER — Encounter: Payer: Self-pay | Admitting: Family Medicine

## 2016-05-17 ENCOUNTER — Ambulatory Visit (INDEPENDENT_AMBULATORY_CARE_PROVIDER_SITE_OTHER): Payer: 59 | Admitting: Family Medicine

## 2016-05-17 VITALS — BP 126/84 | HR 90 | Temp 99.0°F | Resp 20 | Wt 302.0 lb

## 2016-05-17 DIAGNOSIS — J4521 Mild intermittent asthma with (acute) exacerbation: Secondary | ICD-10-CM | POA: Diagnosis not present

## 2016-05-17 MED ORDER — AZITHROMYCIN 250 MG PO TABS: ORAL_TABLET | ORAL | 0 refills | Status: DC

## 2016-05-17 MED ORDER — ALBUTEROL SULFATE HFA 108 (90 BASE) MCG/ACT IN AERS: 1.0000 | INHALATION_SPRAY | Freq: Four times a day (QID) | RESPIRATORY_TRACT | 12 refills | Status: DC | PRN

## 2016-05-17 MED ORDER — BUDESONIDE-FORMOTEROL FUMARATE 80-4.5 MCG/ACT IN AERO: 2.0000 | INHALATION_SPRAY | Freq: Two times a day (BID) | RESPIRATORY_TRACT | 12 refills | Status: DC

## 2016-05-17 NOTE — Progress Notes (Signed)
Patient: Cory Warren Male    DOB: [birth year]   25 y.o.   MRN: [id number] Visit Date: 05/17/2016  Today's Provider: Megan Mansichard Blinda Turek Jr, MD   Chief Complaint  Patient presents with  . URI   Subjective:    URI   This is a new problem. The current episode started in the past 7 days (x 3 days). The problem has been gradually worsening. There has been no fever. Associated symptoms include congestion, coughing (productive with yellow/green sputum), headaches, sinus pain, a sore throat, swollen glands and wheezing. Pertinent negatives include no abdominal pain, chest pain, diarrhea, dysuria, ear pain, nausea, plugged ear sensation, rhinorrhea, sneezing or vomiting. Treatments tried: Singulair. The treatment provided moderate relief.       Allergies  Allergen Reactions  . Biaxin [Clarithromycin] Other (See Comments)    Happen when was a child     Current Outpatient Prescriptions:  .  amphetamine-dextroamphetamine (ADDERALL) 20 MG tablet, Take 1 tablet (20 mg total) by mouth 2 (two) times daily., Disp: 60 tablet, Rfl: 0 .  montelukast (SINGULAIR) 10 MG tablet, Take 1 tablet (10 mg total) by mouth at bedtime., Disp: 30 tablet, Rfl: 5 .  budesonide-formoterol (SYMBICORT) 80-4.5 MCG/ACT inhaler, Inhale 2 puffs into the lungs 2 (two) times daily. (Patient not taking: Reported on 05/17/2016), Disp: 1 Inhaler, Rfl: 12 .  VENTOLIN HFA 108 (90 Base) MCG/ACT inhaler, Use 2 puffs by mouth every  4 hours as needed for  wheezing (Patient not taking: Reported on 05/17/2016), Disp: 18 g, Rfl: 12  Current Facility-Administered Medications:  .  levalbuterol (XOPENEX) nebulizer solution 1.25 mg, 1.25 mg, Nebulization, Once, [name]., MD  Review of Systems  Constitutional: Positive for diaphoresis. Negative for chills.  HENT: Positive for congestion and sore throat. Negative for ear pain, rhinorrhea and sneezing.   Respiratory: Positive for cough (productive with yellow/green  sputum) and wheezing.   Cardiovascular: Negative for chest pain.  Gastrointestinal: Negative for abdominal pain, diarrhea, nausea and vomiting.  Genitourinary: Negative for dysuria.  Allergic/Immunologic: Negative.   Neurological: Positive for headaches.  Psychiatric/Behavioral: Negative.     Social History  Substance Use Topics  . Smoking status: Current Some Day Smoker    Types: Cigarettes  . Smokeless tobacco: Not on file     Comment: occasional smoker  . Alcohol use 0.0 oz/week     Comment: occasionally   Objective:   BP 126/84 (BP Location: Right Arm, Patient Position: Sitting, Cuff Size: Large)   Pulse 90   Temp 99 F (37.2 C) (Oral)   Resp 20   Wt (!) 302 lb (137 kg)   SpO2 97%   BMI 40.96 kg/m   Physical Exam  Constitutional: He appears well-developed and well-nourished.  HENT:  Head: Normocephalic and atraumatic.  Right Ear: External ear normal.  Left Ear: External ear normal.  Nose: Nose normal.  Mouth/Throat: Oropharynx is clear and moist. No oropharyngeal exudate.  Neck: Normal range of motion. Neck supple. No thyromegaly present.  Cardiovascular: Normal rate and regular rhythm.   Pulmonary/Chest: Effort normal and breath sounds normal.  Abdominal: Soft.  Lymphadenopathy:    He has no cervical adenopathy.  Skin: Skin is warm and dry.  Psychiatric: He has a normal mood and affect. His behavior is normal. Judgment and thought content normal.        Assessment & Plan:     1. Mild intermittent asthmatic bronchitis with acute exacerbation Will treat considering pt's history.  Start Z Pak. Pt reports he has taken Z Pak in the past, without allergic reaction. Refill Ventolin and Symbicort. On next routine visit 1 patient have a flare of this asthma will obtain spirometry. 2. Mild obesity 3. ADD    Patient seen and examined by Julieanne Manson, MD, and note scribed by Allene Dillon, CMA.  I have done the exam and reviewed the above chart and it is  accurate to the best of my knowledge.  [name] Wendelyn Breslow, MD  [facility]

## 2016-05-18 MED ORDER — BUDESONIDE-FORMOTEROL FUMARATE 80-4.5 MCG/ACT IN AERO: 2.0000 | INHALATION_SPRAY | Freq: Two times a day (BID) | RESPIRATORY_TRACT | 12 refills | Status: DC

## 2016-11-29 ENCOUNTER — Encounter: Payer: Self-pay | Admitting: Family Medicine

## 2016-11-29 ENCOUNTER — Ambulatory Visit (INDEPENDENT_AMBULATORY_CARE_PROVIDER_SITE_OTHER): Payer: 59 | Admitting: Family Medicine

## 2016-11-29 VITALS — BP 124/86 | HR 96 | Temp 98.8°F | Resp 20 | Ht 71.5 in | Wt 315.0 lb

## 2016-11-29 DIAGNOSIS — J4520 Mild intermittent asthma, uncomplicated: Secondary | ICD-10-CM | POA: Diagnosis not present

## 2016-11-29 DIAGNOSIS — F988 Other specified behavioral and emotional disorders with onset usually occurring in childhood and adolescence: Secondary | ICD-10-CM

## 2016-11-29 MED ORDER — AMPHETAMINE-DEXTROAMPHETAMINE 20 MG PO TABS: 20.0000 mg | ORAL_TABLET | Freq: Two times a day (BID) | ORAL | 0 refills | Status: DC

## 2016-11-29 MED ORDER — ALBUTEROL SULFATE HFA 108 (90 BASE) MCG/ACT IN AERS: 1.0000 | INHALATION_SPRAY | Freq: Four times a day (QID) | RESPIRATORY_TRACT | 12 refills | Status: DC | PRN

## 2016-11-29 NOTE — Progress Notes (Signed)
Patient: Cory Warren Male    DOB: 1991-06-05   26 y.o.   MRN: [id number] Visit Date: 11/29/2016  Today's Provider: Megan Mans, MD   Chief Complaint  Patient presents with  . Attention Deficit Disorder  . Asthma   Subjective:    HPI     Follow up for ADD  The patient was last seen for this 1 year ago. Changes made at last visit include refilling pt's Adderall x 3 months. Last Adderall refill was 10/21/2015. Pt is going to [employer] starting this summer. Pt needs his Adderall refilled prior to starting school.  ------------------------------------------------------------------------------------ Asthma Follow-up: Patient presents for an asthma follow-up; he is not currently in exacerbation. Pt is currently asymptomatic. Observed precipitants include exercise. Pt is currently taking Ventolin PRN and Singulair everyday. Pt reports good compliance with the medication, and good sx control.  Allergies  Allergen Reactions  . Biaxin [Clarithromycin] Other (See Comments)    Happen when was a child; has been able to take Z Pak     Current Outpatient Prescriptions:  .  albuterol (VENTOLIN HFA) 108 (90 Base) MCG/ACT inhaler, Inhale 1-2 puffs into the lungs every 6 (six) hours as needed for wheezing or shortness of breath., Disp: 18 g, Rfl: 12 .  amphetamine-dextroamphetamine (ADDERALL) 20 MG tablet, Take 1 tablet (20 mg total) by mouth 2 (two) times daily., Disp: 60 tablet, Rfl: 0 .  montelukast (SINGULAIR) 10 MG tablet, Take 1 tablet (10 mg total) by mouth at bedtime., Disp: 30 tablet, Rfl: 5  Current Facility-Administered Medications:  .  levalbuterol (XOPENEX) nebulizer solution 1.25 mg, 1.25 mg, Nebulization, Once, Franki Alcaide Hulen Shouts., MD  Review of Systems  Constitutional: Negative for activity change, appetite change, chills, diaphoresis, fatigue, fever and unexpected weight change.  HENT: Negative.   Eyes: Negative.   Respiratory: Negative for cough, chest  tightness, shortness of breath and wheezing.   Cardiovascular: Negative for chest pain, palpitations and leg swelling.  Endocrine: Negative.   Allergic/Immunologic: Negative.   Hematological: Negative.   Psychiatric/Behavioral: Positive for decreased concentration.    Social History  Substance Use Topics  . Smoking status: Never Smoker  . Smokeless tobacco: Never Used  . Alcohol use 0.0 oz/week     Comment: occasionally   Objective:   BP 124/86 (BP Location: Right Arm, Patient Position: Sitting, Cuff Size: Large)   Pulse 96   Temp 98.8 F (37.1 C) (Oral)   Resp 20   Ht 5' 11.5" (1.816 m)   Wt (!) 315 lb (142.9 kg)   SpO2 97%   BMI 43.32 kg/m  Vitals:   11/29/16 1444  BP: 124/86  Pulse: 96  Resp: 20  Temp: 98.8 F (37.1 C)  TempSrc: Oral  SpO2: 97%  Weight: (!) 315 lb (142.9 kg)  Height: 5' 11.5" (1.816 m)     Physical Exam  Constitutional: He is oriented to person, place, and time. He appears well-developed and well-nourished.  HENT:  Head: Normocephalic and atraumatic.  Eyes: Conjunctivae are normal. No scleral icterus.  Neck: Normal range of motion.  Cardiovascular: Normal rate, regular rhythm and normal heart sounds.   Pulmonary/Chest: Effort normal and breath sounds normal. No respiratory distress.  Musculoskeletal: Normal range of motion.  Neurological: He is alert and oriented to person, place, and time.  Skin: Skin is warm and dry.  Psychiatric: He has a normal mood and affect. His behavior is normal. Judgment and thought content normal.  Assessment & Plan:     1. Attention deficit disorder, unspecified hyperactivity presence Stable. Refills provided. FU 3 months or PRN for refills. - amphetamine-dextroamphetamine (ADDERALL) 20 MG tablet; Take 1 tablet (20 mg total) by mouth 2 (two) times daily.  Dispense: 60 tablet; Refill: 0  2. Intermittent asthma without complication, unspecified asthma severity Spirometry showed mild asthma. Will  continue Proventil. Continue Singulair. Pt is exercising more frequently now. Advised pt he may need to use Proventil before every workout. Will recheck spirometry yearly to monitor lung health. - albuterol (VENTOLIN HFA) 108 (90 Base) MCG/ACT inhaler; Inhale 1-2 puffs into the lungs every 6 (six) hours as needed for wheezing or shortness of breath.  Dispense: 18 g; Refill: 12 - Spirometry with Graph     Patient seen and examined by Julieanne Manson, MD, and note scribed by Allene Dillon, CMA.  Evianna Chandran Wendelyn Breslow, MD  [facility]

## 2016-11-30 ENCOUNTER — Telehealth: Payer: Self-pay | Admitting: Family Medicine

## 2016-11-30 DIAGNOSIS — H1830 Unspecified corneal membrane change: Secondary | ICD-10-CM | POA: Diagnosis not present

## 2016-11-30 DIAGNOSIS — H18892 Other specified disorders of cornea, left eye: Secondary | ICD-10-CM | POA: Diagnosis not present

## 2016-11-30 DIAGNOSIS — H209 Unspecified iridocyclitis: Secondary | ICD-10-CM | POA: Diagnosis not present

## 2016-11-30 NOTE — Telephone Encounter (Signed)
Working on this-aa 

## 2016-11-30 NOTE — Telephone Encounter (Signed)
Pt's mom called saying son came in yeasterday and seen DR,. G and got a Rx for Adderall.  He took it to the pharmacy and had it filled.  Mom said he paid full price 80.00 for the rx.  There needs to be a PA done for the copay.  He could have just pd a 5$ copay.  Mom talked to the pharmacy and they said if we called the pharmacy and had a new rx wrote for yesterdays date they could refund the money when the PA goes through.  They use [facility] [address]  Ilda Basset [phone]  Please let them know if and when the PA goes through.  Mom's call [phone]  Thanks Barth Kirks

## 2016-12-04 NOTE — Telephone Encounter (Signed)
Mother advised and pharmacy advised that Adderall is approved through 11/29/17-aa

## 2017-02-04 ENCOUNTER — Other Ambulatory Visit: Payer: Self-pay | Admitting: Family Medicine

## 2017-02-04 DIAGNOSIS — J4520 Mild intermittent asthma, uncomplicated: Secondary | ICD-10-CM

## 2017-02-28 ENCOUNTER — Other Ambulatory Visit: Payer: Self-pay | Admitting: Family Medicine

## 2017-02-28 DIAGNOSIS — J4530 Mild persistent asthma, uncomplicated: Secondary | ICD-10-CM

## 2017-08-03 ENCOUNTER — Encounter: Payer: Self-pay | Admitting: Family Medicine

## 2017-08-03 ENCOUNTER — Ambulatory Visit: Payer: 59 | Admitting: Family Medicine

## 2017-08-03 VITALS — BP 138/98 | HR 89 | Temp 98.1°F | Wt 302.6 lb

## 2017-08-03 DIAGNOSIS — J029 Acute pharyngitis, unspecified: Secondary | ICD-10-CM

## 2017-08-03 DIAGNOSIS — M654 Radial styloid tenosynovitis [de Quervain]: Secondary | ICD-10-CM | POA: Insufficient documentation

## 2017-08-03 DIAGNOSIS — M2390 Unspecified internal derangement of unspecified knee: Secondary | ICD-10-CM | POA: Insufficient documentation

## 2017-08-03 NOTE — Progress Notes (Signed)
     Patient: Cory Warren Male    DOB: 1990/11/23   26 y.o.   MRN: [id number] Visit Date: 08/03/2017  Today's Provider: Dortha Kernennis Orville Widmann, PA   Chief Complaint  Patient presents with  . URI   Subjective:    URI   This is a new problem. Episode onset: 2 days ago. The problem has been gradually worsening. There has been no fever. Associated symptoms include congestion and a sore throat. Associated symptoms comments: Drainage . He has tried nothing for the symptoms.   History reviewed. No pertinent past medical history. Patient Active Problem List   Diagnosis Date Noted  . Derangement of knee 08/03/2017  . Radial styloid tenosynovitis 08/03/2017  . ADD (attention deficit disorder) 07/21/2015  . Problems influencing health status 07/21/2015  . Airway hyperreactivity 07/21/2015  . Adiposity 07/21/2015  . Migraine 07/21/2015   Past Surgical History:  Procedure Laterality Date  . KNEE SURGERY     Family History  Problem Relation Age of Onset  . Hyperlipidemia Mother   . Hypertension Father   . Asthma Sister    Allergies  Allergen Reactions  . Biaxin [Clarithromycin] Other (See Comments)    Happen when was a child; has been able to take Z Pak    Current Outpatient Medications:  .  amphetamine-dextroamphetamine (ADDERALL) 20 MG tablet, Take 1 tablet (20 mg total) by mouth 2 (two) times daily., Disp: 60 tablet, Rfl: 0 .  montelukast (SINGULAIR) 10 MG tablet, TAKE 1 TABLET BY MOUTH  EVERY DAY, Disp: 90 tablet, Rfl: 3 .  VENTOLIN HFA 108 (90 Base) MCG/ACT inhaler, USE 2 PUFFS BY MOUTH EVERY  4 HOURS AS NEEDED FOR  WHEEZING, Disp: 54 g, Rfl: 11  Current Facility-Administered Medications:  .  levalbuterol (XOPENEX) nebulizer solution 1.25 mg, 1.25 mg, Nebulization, Once, [name], Richard L Jr., MD  Review of Systems  Constitutional: Negative.   HENT: Positive for congestion, postnasal drip and sore throat.   Respiratory: Negative.   Cardiovascular: Negative.    Social  History   Tobacco Use  . Smoking status: Never Smoker  . Smokeless tobacco: Never Used  Substance Use Topics  . Alcohol use: Yes    Alcohol/week: 0.0 oz    Comment: occasionally   Objective:   BP (!) 138/98 (BP Location: Right Arm, Patient Position: Sitting, Cuff Size: Normal)   Pulse 89   Temp 98.1 F (36.7 C) (Oral)   Wt (!) 302 lb 9.6 oz (137.3 kg)   SpO2 97%   BMI 41.62 kg/m    Physical Exam  Constitutional: He appears well-developed and well-nourished.  HENT:  Head: Normocephalic.  Right Ear: External ear normal.  Left Ear: External ear normal.  Nose: Nose normal.  Slightly cobblestoning of posterior pharynx. No exudates.  Eyes: Conjunctivae are normal. Pupils are equal, round, and reactive to light.  Neck: Neck supple.  Cardiovascular: Normal rate.  Pulmonary/Chest: Effort normal.  Abdominal: Soft. Bowel sounds are normal.  Lymphadenopathy:    He has no cervical adenopathy.  Psychiatric: He has a normal mood and affect. His speech is normal. Thought content normal. He is hyperactive.      Assessment & Plan:     1. Viral pharyngitis Onset 2 days ago without fever or cough. Some PND. No headache or dizziness. May use Dayquil and saltwater gargles. Recheck if no better or worsening the next 7 days.       Dortha Kernennis Shandra Szymborski, PA  [facility]

## 2017-08-03 NOTE — Patient Instructions (Signed)

## 2018-02-10 ENCOUNTER — Other Ambulatory Visit: Payer: Self-pay | Admitting: Family Medicine

## 2018-02-10 DIAGNOSIS — J4520 Mild intermittent asthma, uncomplicated: Secondary | ICD-10-CM

## 2018-02-12 ENCOUNTER — Other Ambulatory Visit: Payer: Self-pay | Admitting: Family Medicine

## 2018-02-12 DIAGNOSIS — J4520 Mild intermittent asthma, uncomplicated: Secondary | ICD-10-CM

## 2018-02-12 MED ORDER — VENTOLIN HFA 108 (90 BASE) MCG/ACT IN AERS: 1.0000 | INHALATION_SPRAY | RESPIRATORY_TRACT | 11 refills | Status: DC | PRN

## 2018-02-12 NOTE — Telephone Encounter (Signed)
Medication was sent

## 2018-02-12 NOTE — Telephone Encounter (Signed)
Pt contacted office for refill request on the following medications:  VENTOLIN HFA 108 (90 Base) MCG/ACT inhaler  [facility] [address]  Pt stated that [facility] was supposed to request the refill [facility]. Please advise. Thanks TNP

## 2018-03-17 ENCOUNTER — Emergency Department (HOSPITAL_COMMUNITY): Payer: Managed Care, Other (non HMO)

## 2018-03-17 ENCOUNTER — Emergency Department (HOSPITAL_COMMUNITY)
Admission: EM | Admit: 2018-03-17 | Discharge: 2018-03-17 | Disposition: A | Discharge status: Home / Self Care | Payer: Managed Care, Other (non HMO) | Attending: Emergency Medicine | Admitting: Emergency Medicine

## 2018-03-17 ENCOUNTER — Other Ambulatory Visit: Payer: Self-pay

## 2018-03-17 ENCOUNTER — Encounter (HOSPITAL_COMMUNITY): Payer: Self-pay

## 2018-03-17 DIAGNOSIS — F1210 Cannabis abuse, uncomplicated: Secondary | ICD-10-CM | POA: Diagnosis not present

## 2018-03-17 DIAGNOSIS — Z79899 Other long term (current) drug therapy: Secondary | ICD-10-CM | POA: Insufficient documentation

## 2018-03-17 DIAGNOSIS — R079 Chest pain, unspecified: Secondary | ICD-10-CM | POA: Diagnosis present

## 2018-03-17 DIAGNOSIS — I309 Acute pericarditis, unspecified: Secondary | ICD-10-CM | POA: Insufficient documentation

## 2018-03-17 LAB — CBC
HEMATOCRIT: 46.2 % (ref 39.0–52.0)
HEMOGLOBIN: 16 g/dL (ref 13.0–17.0)
MCH: 29.8 pg (ref 26.0–34.0)
MCHC: 34.6 g/dL (ref 30.0–36.0)
MCV: 86 fL (ref 78.0–100.0)
Platelets: 327 10*3/uL (ref 150–400)
RBC: 5.37 MIL/uL (ref 4.22–5.81)
RDW: 12.6 % (ref 11.5–15.5)
WBC: 8.7 10*3/uL (ref 4.0–10.5)

## 2018-03-17 LAB — BASIC METABOLIC PANEL
ANION GAP: 9 (ref 5–15)
BUN: 16 mg/dL (ref 6–20)
CO2: 23 mmol/L (ref 22–32)
Calcium: 9.3 mg/dL (ref 8.9–10.3)
Chloride: 108 mmol/L (ref 98–111)
Creatinine, Ser: 1.04 mg/dL (ref 0.61–1.24)
GFR calc Af Amer: >60 mL/min (ref >60)
GLUCOSE: 101 mg/dL — AB (ref 70–99)
Potassium: 5.8 mmol/L — ABNORMAL HIGH (ref 3.5–5.1)
Sodium: 140 mmol/L (ref 135–145)

## 2018-03-17 LAB — I-STAT TROPONIN, ED: Troponin i, poc: 0 ng/mL (ref 0.00–0.08)

## 2018-03-17 MED ORDER — IBUPROFEN 600 MG PO TABS: 600.0000 mg | ORAL_TABLET | Freq: Three times a day (TID) | ORAL | 0 refills | Status: AC | PRN

## 2018-03-17 NOTE — Discharge Instructions (Addendum)
Your labs and chest x-ray normal. Your EKG and exam suggest inflammation called pericarditis. Take ibuprofen as prescribed and follow up with a primary care physician of your choice for further outpatient evaluation if symptoms continue. Return here any time for evaluation of worsening symptoms or new concern.

## 2018-03-17 NOTE — ED Provider Notes (Addendum)
[facility]-EMERGENCY DEPT Provider Note   CSN: [id number] Arrival date & time: 03/17/18  2105     History   Chief Complaint Chief Complaint  Patient presents with  . Chest Pain    HPI Cory Warren is a 27 y.o. male.  Patient presents with chest tightness in the center chest that started approximately one hour prior to arrival. He has had similar symptoms that were mild for the past week, and tonight he became worried because symptoms intensified. No cough, SOB, nausea, vomiting, diaphoresis. He reports symptoms started when he stopped using marijuana on a regular basis. Last use was about 10 days ago. No history of anxiety or panic. He is currently asymptomatic.   The history is provided by the patient. No language interpreter was used.  Chest Pain   Pertinent negatives include no abdominal pain, no fever, no nausea, no palpitations, no shortness of breath and no vomiting.    History reviewed. No pertinent past medical history.  Patient Active Problem List   Diagnosis Date Noted  . Derangement of knee 08/03/2017  . Radial styloid tenosynovitis 08/03/2017  . ADD (attention deficit disorder) 07/21/2015  . Problems influencing health status 07/21/2015  . Airway hyperreactivity 07/21/2015  . Adiposity 07/21/2015  . Migraine 07/21/2015    Past Surgical History:  Procedure Laterality Date  . KNEE SURGERY          Home Medications    Prior to Admission medications   Medication Sig Start Date End Date Taking? Authorizing Provider  ibuprofen (ADVIL,MOTRIN) 200 MG tablet Take 400 mg by mouth every 6 (six) hours as needed for fever or moderate pain.   Yes [provider]  montelukast (SINGULAIR) 10 MG tablet TAKE 1 TABLET BY MOUTH  EVERY DAY 03/01/17  Yes [name], Richard L Jr., MD  Multiple Vitamin (MULTIVITAMIN WITH MINERALS) TABS tablet Take 1 tablet by mouth daily.   Yes [provider]  VENTOLIN HFA 108 (90 Base) MCG/ACT  inhaler Inhale 1-2 puffs into the lungs every 4 (four) hours as needed for wheezing or shortness of breath. 02/12/18  Yes [name], Richard L Jr., MD  amphetamine-dextroamphetamine (ADDERALL) 20 MG tablet Take 1 tablet (20 mg total) by mouth 2 (two) times daily. Patient not taking: Reported on 03/17/2018 11/29/16   [name], Richard L Jr., MD    Family History Family History  Problem Relation Age of Onset  . Hyperlipidemia Mother   . Hypertension Father   . Asthma Sister     Social History Social History   Tobacco Use  . Smoking status: Never Smoker  . Smokeless tobacco: Never Used  Substance Use Topics  . Alcohol use: Yes    Alcohol/week: 0.0 standard drinks    Comment: occasionally  . Drug use: Yes    Types: Marijuana     Allergies   Biaxin [clarithromycin]   Review of Systems Review of Systems  Constitutional: Negative for chills and fever.  HENT: Negative.   Respiratory: Positive for chest tightness. Negative for shortness of breath.   Cardiovascular: Negative for palpitations.  Gastrointestinal: Negative.  Negative for abdominal pain, nausea and vomiting.  Musculoskeletal: Negative.  Negative for myalgias.  Skin: Negative.   Neurological: Negative.      Physical Exam Updated Vital Signs BP (!) 151/100   Pulse 79   Temp 98.2 F (36.8 C) (Oral)   Resp (!) 28   Ht 6' (1.829 m)   Wt 136.1 kg   SpO2 99%   BMI  40.69 kg/m   Physical Exam  Constitutional: He is oriented to person, place, and time. He appears well-developed and well-nourished.  HENT:  Head: Normocephalic.  Eyes: Conjunctivae are normal.  Neck: Normal range of motion. Neck supple. Carotid bruit is not present.  Cardiovascular: Normal rate and regular rhythm.  No murmur heard. Pulmonary/Chest: Effort normal and breath sounds normal. He has no wheezes. He has no rales. He exhibits tenderness.    Abdominal: Soft. Bowel sounds are normal. There is no tenderness. There is no rebound and no  guarding.  Musculoskeletal: Normal range of motion.  Neurological: He is alert and oriented to person, place, and time.  Skin: Skin is warm and dry. No rash noted.  Psychiatric: He has a normal mood and affect.  Nursing note and vitals reviewed.    ED Treatments / Results  Labs (all labs ordered are listed, but only abnormal results are displayed) Labs Reviewed  BASIC METABOLIC PANEL - Abnormal; Notable for the following components:      Result Value   Potassium 5.8 (*)    Glucose, Bld 101 (*)    All other components within normal limits  CBC  I-STAT TROPONIN, ED   Results for orders placed or performed during the hospital encounter of 03/17/18  Basic metabolic panel  Result Value Ref Range   Sodium 140 135 - 145 mmol/L   Potassium 5.8 (H) 3.5 - 5.1 mmol/L   Chloride 108 98 - 111 mmol/L   CO2 23 22 - 32 mmol/L   Glucose, Bld 101 (H) 70 - 99 mg/dL   BUN 16 6 - 20 mg/dL   Creatinine, Ser 1.61 0.61 - 1.24 mg/dL   Calcium 9.3 8.9 - 09.6 mg/dL   GFR calc non Af Amer >60 >60 mL/min   GFR calc Af Amer >60 >60 mL/min   Anion gap 9 5 - 15  CBC  Result Value Ref Range   WBC 8.7 4.0 - 10.5 K/uL   RBC 5.37 4.22 - 5.81 MIL/uL   Hemoglobin 16.0 13.0 - 17.0 g/dL   HCT 04.5 40.9 - 81.1 %   MCV 86.0 78.0 - 100.0 fL   MCH 29.8 26.0 - 34.0 pg   MCHC 34.6 30.0 - 36.0 g/dL   RDW 91.4 78.2 - 95.6 %   Platelets 327 150 - 400 K/uL  I-stat troponin, ED  Result Value Ref Range   Troponin i, poc 0.00 0.00 - 0.08 ng/mL   Comment 3            EKG EKG Interpretation  Date/Time:  Sunday March 17 2018 21:11:36 EDT Ventricular Rate:  92 PR Interval:    QRS Duration: 97 QT Interval:  337 QTC Calculation: 417 R Axis:   66 Text Interpretation:  Sinus rhythm Consider left atrial enlargement ST elevation suggests acute pericarditis No old tracing to compare Confirmed by Marily Memos [phone]) on 03/17/2018 11:11:40 PM   Radiology Dg Chest 2 View  Result Date: 03/17/2018 CLINICAL DATA:   Generalized chest tightness and pressure with lightheadedness starting about our ago. Smoker. EXAM: CHEST - 2 VIEW COMPARISON:  None. FINDINGS: Normal heart size and pulmonary vascularity. No focal airspace disease or consolidation in the lungs. No blunting of costophrenic angles. No pneumothorax. Mediastinal contours appear intact. IMPRESSION: No active cardiopulmonary disease. Electronically Signed   By: Burman Nieves M.D.   On: 03/17/2018 22:05    Procedures Procedures (including critical care time)  Medications Ordered in ED Medications - No data to display  Initial Impression / Assessment and Plan / ED Course  I have reviewed the triage vital signs and the nursing notes.  Pertinent labs & imaging results that were available during my care of the patient were reviewed by me and considered in my medical decision making (see chart for details).     Patient presents for evaluation of chest tightness in the center chest that started shortly before arrival. Currently asymptomatic. Symptoms progressive over the last week with worst episode tonight.   Symptoms started about one week ago and have been progressive, corresponding to stopping regular marijuana use. Cardiac evaluation of atypical chest discomfort is negative. Doubt ACS. No risk factors for PE, no tachycardia/hypoxia.   Feel the patient is appropriate for discharge home. REcommend getting established with a primary care of his choice for further outpatient evaluation if symptoms continue.  Final Clinical Impressions(s) / ED Diagnoses   Final diagnoses:  Nonspecific chest pain   1. Pericarditis 2. Chest wall pain  ED Discharge Orders    None       Elpidio Anis, PA-C 03/17/18 2302    Elpidio Anis, PA-C 03/17/18 2324    Mesner, Barbara Cower, MD 03/18/18 [phone]

## 2018-03-17 NOTE — ED Notes (Signed)
ED Provider at bedside. 

## 2018-03-17 NOTE — ED Triage Notes (Signed)
Pt c/o generalized chest tightness and pressure + light headedness starting about 1 hr ag. No shortness of breath, N/V.

## 2018-03-19 ENCOUNTER — Encounter (HOSPITAL_COMMUNITY): Payer: Self-pay

## 2018-03-19 ENCOUNTER — Emergency Department (HOSPITAL_COMMUNITY)
Admission: EM | Admit: 2018-03-19 | Discharge: 2018-03-19 | Disposition: A | Discharge status: Home / Self Care | Payer: Managed Care, Other (non HMO) | Attending: Emergency Medicine | Admitting: Emergency Medicine

## 2018-03-19 ENCOUNTER — Other Ambulatory Visit: Payer: Self-pay

## 2018-03-19 DIAGNOSIS — J45909 Unspecified asthma, uncomplicated: Secondary | ICD-10-CM | POA: Insufficient documentation

## 2018-03-19 DIAGNOSIS — R0789 Other chest pain: Secondary | ICD-10-CM | POA: Diagnosis not present

## 2018-03-19 DIAGNOSIS — Z79899 Other long term (current) drug therapy: Secondary | ICD-10-CM | POA: Insufficient documentation

## 2018-03-19 DIAGNOSIS — I160 Hypertensive urgency: Secondary | ICD-10-CM | POA: Insufficient documentation

## 2018-03-19 DIAGNOSIS — R079 Chest pain, unspecified: Secondary | ICD-10-CM | POA: Diagnosis present

## 2018-03-19 HISTORY — DX: Essential (primary) hypertension: I10

## 2018-03-19 HISTORY — DX: Unspecified asthma, uncomplicated: J45.909

## 2018-03-19 LAB — CBC WITH DIFFERENTIAL/PLATELET
BASOS ABS: 0 10*3/uL (ref 0.0–0.1)
Basophils Relative: 0 %
EOS ABS: 0.2 10*3/uL (ref 0.0–0.7)
EOS PCT: 2 %
HCT: 47.5 % (ref 39.0–52.0)
Hemoglobin: 16.4 g/dL (ref 13.0–17.0)
Lymphocytes Relative: 25 %
Lymphs Abs: 1.8 10*3/uL (ref 0.7–4.0)
MCH: 29.7 pg (ref 26.0–34.0)
MCHC: 34.5 g/dL (ref 30.0–36.0)
MCV: 85.9 fL (ref 78.0–100.0)
Monocytes Absolute: 0.6 10*3/uL (ref 0.1–1.0)
Monocytes Relative: 9 %
Neutro Abs: 4.5 10*3/uL (ref 1.7–7.7)
Neutrophils Relative %: 64 %
PLATELETS: 301 10*3/uL (ref 150–400)
RBC: 5.53 MIL/uL (ref 4.22–5.81)
RDW: 12.5 % (ref 11.5–15.5)
WBC: 7 10*3/uL (ref 4.0–10.5)

## 2018-03-19 LAB — BASIC METABOLIC PANEL
Anion gap: 8 (ref 5–15)
BUN: 16 mg/dL (ref 6–20)
CO2: 28 mmol/L (ref 22–32)
CREATININE: 0.98 mg/dL (ref 0.61–1.24)
Calcium: 9.2 mg/dL (ref 8.9–10.3)
Chloride: 104 mmol/L (ref 98–111)
GFR calc Af Amer: >60 mL/min (ref >60)
Glucose, Bld: 89 mg/dL (ref 70–99)
Potassium: 4.6 mmol/L (ref 3.5–5.1)
SODIUM: 140 mmol/L (ref 135–145)

## 2018-03-19 MED ORDER — HYDROCHLOROTHIAZIDE 25 MG PO TABS: 25.0000 mg | ORAL_TABLET | Freq: Every day | ORAL | 0 refills | Status: DC

## 2018-03-19 MED ORDER — KETOROLAC TROMETHAMINE 30 MG/ML IJ SOLN
15.0000 mg | Freq: Once | INTRAMUSCULAR | Status: AC
  Administered 2018-03-19: 15 mg via INTRAMUSCULAR
  Filled 2018-03-19: qty 1

## 2018-03-19 MED ORDER — HYDROCHLOROTHIAZIDE 12.5 MG PO CAPS
25.0000 mg | ORAL_CAPSULE | Freq: Once | ORAL | Status: AC
  Administered 2018-03-19: 25 mg via ORAL
  Filled 2018-03-19: qty 2

## 2018-03-19 NOTE — ED Triage Notes (Signed)
patient c/o intermittent lower bilateral chest pain x 2 weeks, numbness in the back of his head, and "strange feeling" in his throat. Patient denied having a sore throat, but states it feels like it is swollen.  Patient refused EKG and states that he feels like he needs to have other things looked into.

## 2018-03-19 NOTE — Discharge Instructions (Addendum)
As discussed, your evaluation today has been largely reassuring.  But, it is important that you monitor your condition carefully, and do not hesitate to return to the ED if you develop new, or concerning changes in your condition.  For the next 3 days please use ibuprofen 400 mg, 3 times daily in addition to the prescribed blood pressure medication.  Please be sure to follow-up with our cardiology colleagues, or your primary care physician.

## 2018-03-19 NOTE — ED Notes (Signed)
Informed patient I needed to connect him to the monitor and take a snapshot of his heart. Patient replied. "Ok" EKG obtained.

## 2018-03-19 NOTE — ED Provider Notes (Signed)
[facility]-EMERGENCY DEPT Provider Note   CSN: [id number] Arrival date & time: 03/19/18  1443     History   Chief Complaint Chief Complaint  Patient presents with  . Chest Pain  . numbness back of head    HPI Cory Warren is a 27 y.o. male.  HPI  She presents with concern of episodic chest pain, posterior head pain. Patient is here to days ago, had evaluation, including labs, EKG, x-ray. He notes that on discharge, in spite of using ibuprofen, sporadically has persistent pain, though improved, less appreciable with palpation that was previously. He has a notable history of chronic marijuana use of stopping about 1 week ago, about the time of onset of illness. Since that time the pain is been persistent, is episodic across the anterior chest in varying locations, with some soreness in the occiput, but no neck pain, no neurologic complaints. Patient acknowledges history of being overweight, as well as concern for hypertension, though he denies formal diagnosis of hypertension.   Past Medical History:  Diagnosis Date  . Asthma   . Hypertension     Patient Active Problem List   Diagnosis Date Noted  . Derangement of knee 08/03/2017  . Radial styloid tenosynovitis 08/03/2017  . ADD (attention deficit disorder) 07/21/2015  . Problems influencing health status 07/21/2015  . Airway hyperreactivity 07/21/2015  . Adiposity 07/21/2015  . Migraine 07/21/2015    Past Surgical History:  Procedure Laterality Date  . KNEE SURGERY          Home Medications    Prior to Admission medications   Medication Sig Start Date End Date Taking? Authorizing Provider  ibuprofen (ADVIL,MOTRIN) 600 MG tablet Take 1 tablet (600 mg total) by mouth every 8 (eight) hours as needed for up to 10 days for fever or moderate pain. 03/17/18 03/27/18 Yes Upstill, Melvenia BeamShari, PA-C  montelukast (SINGULAIR) 10 MG tablet TAKE 1 TABLET BY MOUTH  EVERY DAY 03/01/17  Yes [name],  Richard L Jr., MD  Multiple Vitamin (MULTIVITAMIN WITH MINERALS) TABS tablet Take 1 tablet by mouth daily.   Yes [provider]  VENTOLIN HFA 108 (90 Base) MCG/ACT inhaler Inhale 1-2 puffs into the lungs every 4 (four) hours as needed for wheezing or shortness of breath. 02/12/18  Yes [name], Richard L Jr., MD  amphetamine-dextroamphetamine (ADDERALL) 20 MG tablet Take 1 tablet (20 mg total) by mouth 2 (two) times daily. Patient not taking: Reported on 03/19/2018 11/29/16   [name], Richard L Jr., MD    Family History Family History  Problem Relation Age of Onset  . Hyperlipidemia Mother   . Hypertension Father   . Asthma Sister     Social History Social History   Tobacco Use  . Smoking status: Never Smoker  . Smokeless tobacco: Never Used  Substance Use Topics  . Alcohol use: Yes    Alcohol/week: 0.0 standard drinks    Comment: occasionally  . Drug use: Yes    Types: Marijuana    Comment: last used 1 1/2 weeks ago.     Allergies   Biaxin [clarithromycin]   Review of Systems Review of Systems  Constitutional:       Per HPI, otherwise negative  HENT:       Per HPI, otherwise negative  Respiratory:       Per HPI, otherwise negative  Cardiovascular:       Per HPI, otherwise negative  Gastrointestinal: Negative for vomiting.  Endocrine:  Negative aside from HPI  Genitourinary:       Neg aside from HPI   Musculoskeletal:       Per HPI, otherwise negative  Skin: Negative.   Neurological: Negative for syncope.     Physical Exam Updated Vital Signs BP (!) 163/89   Pulse 75   Temp 98.8 F (37.1 C) (Oral)   Resp 12   Ht 6' (1.829 m)   Wt 136.1 kg   SpO2 100%   BMI 40.69 kg/m   Physical Exam  Constitutional: He is oriented to person, place, and time. He appears well-developed. No distress.  HENT:  Head: Normocephalic and atraumatic.  Eyes: Conjunctivae and EOM are normal.  Cardiovascular: Normal rate and regular rhythm.  Pulmonary/Chest:  Effort normal. No stridor. No respiratory distress.  Abdominal: He exhibits no distension.  Musculoskeletal: He exhibits no edema.  Neurological: He is alert and oriented to person, place, and time. He displays no atrophy and no tremor. No cranial nerve deficit. He exhibits normal muscle tone. He displays no seizure activity. Coordination normal.  Skin: Skin is warm and dry.  Psychiatric: His mood appears anxious. Cognition and memory are not impaired.  Nursing note and vitals reviewed.    ED Treatments / Results  Labs (all labs ordered are listed, but only abnormal results are displayed) Labs Reviewed  BASIC METABOLIC PANEL  CBC WITH DIFFERENTIAL/PLATELET    EKG EKG Interpretation  Date/Time:  Tuesday March 19 2018 15:12:53 EDT Ventricular Rate:  84 PR Interval:    QRS Duration: 95 QT Interval:  371 QTC Calculation: 439 R Axis:   30 Text Interpretation:  Sinus rhythm ST elev, probable normal early repol pattern No significant change since last tracing Abnormal ekg Confirmed by Gerhard Munch [phone]) on 03/19/2018 3:28:41 PM   Radiology Dg Chest 2 View  Result Date: 03/17/2018 CLINICAL DATA:  Generalized chest tightness and pressure with lightheadedness starting about our ago. Smoker. EXAM: CHEST - 2 VIEW COMPARISON:  None. FINDINGS: Normal heart size and pulmonary vascularity. No focal airspace disease or consolidation in the lungs. No blunting of costophrenic angles. No pneumothorax. Mediastinal contours appear intact. IMPRESSION: No active cardiopulmonary disease. Electronically Signed   By: Burman Nieves M.D.   On: 03/17/2018 22:05    Procedures Procedures (including critical care time)  Medications Ordered in ED Medications  ketorolac (TORADOL) 30 MG/ML injection 15 mg (15 mg Intramuscular Given 03/19/18 1645)  hydrochlorothiazide (MICROZIDE) capsule 25 mg (25 mg Oral Given 03/19/18 1812)     Initial Impression / Assessment and Plan / ED Course  I have reviewed  the triage vital signs and the nursing notes.  Pertinent labs & imaging results that were available during my care of the patient were reviewed by me and considered in my medical decision making (see chart for details).   Initial evaluation reviewed the patient's chart including notation of hypertension previously, and hyperkalemia. Repeat labs are notable for normal potassium value, though the patient remains hypertensive. After receiving Toradol, he is in no distress, is awake, alert, though persistently hypertensive.   7:04 PM Patient in no distress awake, alert, speaking clearly, blood pressure has reduced appropriately. Patient now accompanied by his wife. We discussed all findings from today and visit 2 days ago, reassuring results, no evidence for ACS or other acute new chest phenomenon. Some suspicion for inflammatory condition, with consideration of hypertensive urgency. Absent other complaints, with normalized vital signs, no distress, patient will follow-up with cardiology as an outpatient. He will  start antihypertensive on discharge.  Final Clinical Impressions(s) / ED Diagnoses  Atypical chest pain Hypertensive urgency   Gerhard MunchLockwood, Kaitlan Bin, MD 03/19/18 1905

## 2018-03-22 ENCOUNTER — Ambulatory Visit: Payer: 59 | Admitting: Family Medicine

## 2018-03-28 ENCOUNTER — Ambulatory Visit: Payer: Managed Care, Other (non HMO) | Admitting: Cardiology

## 2018-03-28 ENCOUNTER — Encounter: Payer: Self-pay | Admitting: Cardiology

## 2018-03-28 VITALS — BP 144/78 | HR 91 | Ht 72.0 in | Wt 308.4 lb

## 2018-03-28 DIAGNOSIS — E663 Overweight: Secondary | ICD-10-CM | POA: Diagnosis not present

## 2018-03-28 DIAGNOSIS — R079 Chest pain, unspecified: Secondary | ICD-10-CM | POA: Insufficient documentation

## 2018-03-28 NOTE — Progress Notes (Signed)
Cardiology Office Note:    Date:  03/28/2018   ID:  Cory Warren, DOB Apr 17, 1991, MRN [id number]  PCP:  Jerrol Banana., MD  Cardiologist:  Jenean Lindau, MD   Referring MD: Jerrol Banana.,*    ASSESSMENT:    1. Chest pain, unspecified type   2. Overweight    PLAN:    In order of problems listed above:  1. The patient's picture is not diagnostic of pericarditis.  First and foremost at this time he is asymptomatic.  Given when he went to the emergency room I cannot confidently and for sure say that he might have an element of pericarditis.  It is possible that he had pericarditis.  The patient is receiving anti-inflammatory medications and is completely asymptomatic at this time.  I will check his ESR and also get an echocardiogram.  I will continue his current medications.  At this time to introduce a more toxic medication such as colchicine in the absence of any significant symptoms he is taking and I am not sure of it.  His EKG today is completely fine.  I discussed this with him at length benefits and potential risks and pros and cons of both pathways were discussed and he is keen to continue only NSAIDs at this time. 2. Echocardiogram will be done also to assess his symptoms and he will be seen in follow-up appointment in 3 weeks or earlier if he has any concerns.  He knows to go to the nearest emergency room for any significant concerns.   Medication Adjustments/Labs and Tests Ordered: Current medicines are reviewed at length with the patient today.  Concerns regarding medicines are outlined above.  Orders Placed This Encounter  Procedures  . Sedimentation rate  . EKG 12-Lead  . ECHOCARDIOGRAM COMPLETE   No orders of the defined types were placed in this encounter.    History of Present Illness:    Cory Warren is a 27 y.o. male who is being seen today for the evaluation of chest pain at the request of Jerrol Banana.,*.  Patient is a  pleasant 27 year old male.  He has no significant past medical history.  He mentions to me that he went to the emergency room twice with chest pain.  The second time he went he was treated for the possibility of pericarditis.  The patient mentions to me that he is taking nonsteroidal anti-inflammatory medications at this time for the past 5 days.  He is completely asymptomatic at this time.  No chest pain orthopnea or PND.  Changes in posture or deep breathing or coughing do not reproduce his symptoms and I tried his when I was in the room with him.  Past Medical History:  Diagnosis Date  . Asthma   . Hypertension     Past Surgical History:  Procedure Laterality Date  . KNEE SURGERY      Current Medications: Current Meds  Medication Sig  . hydrochlorothiazide (HYDRODIURIL) 25 MG tablet Take 1 tablet (25 mg total) by mouth daily.  . montelukast (SINGULAIR) 10 MG tablet TAKE 1 TABLET BY MOUTH  EVERY DAY  . Multiple Vitamin (MULTIVITAMIN WITH MINERALS) TABS tablet Take 1 tablet by mouth daily.  . VENTOLIN HFA 108 (90 Base) MCG/ACT inhaler Inhale 1-2 puffs into the lungs every 4 (four) hours as needed for wheezing or shortness of breath.   Current Facility-Administered Medications for the 03/28/18 encounter (Office Visit) with Revankar, Reita Cliche, MD  Medication  .  levalbuterol (XOPENEX) nebulizer solution 1.25 mg     Allergies:   Biaxin [clarithromycin]   Social History   Socioeconomic History  . Marital status: Single    Spouse name: Not on file  . Number of children: Not on file  . Years of education: Not on file  . Highest education level: Not on file  Occupational History  . Not on file  Social Needs  . Financial resource strain: Not on file  . Food insecurity:    Worry: Not on file    Inability: Not on file  . Transportation needs:    Medical: Not on file    Non-medical: Not on file  Tobacco Use  . Smoking status: Never Smoker  . Smokeless tobacco: Never Used    Substance and Sexual Activity  . Alcohol use: Yes    Alcohol/week: 0.0 standard drinks    Comment: occasionally  . Drug use: Yes    Types: Marijuana    Comment: last used 1 1/2 weeks ago.  Marland Kitchen Sexual activity: Not on file  Lifestyle  . Physical activity:    Days per week: Not on file    Minutes per session: Not on file  . Stress: Not on file  Relationships  . Social connections:    Talks on phone: Not on file    Gets together: Not on file    Attends religious service: Not on file    Active member of club or organization: Not on file    Attends meetings of clubs or organizations: Not on file    Relationship status: Not on file  Other Topics Concern  . Not on file  Social History Narrative  . Not on file     Family History: The patient's family history includes Asthma in his sister; Hyperlipidemia in his mother; Hypertension in his father.  ROS:   Please see the history of present illness.    All other systems reviewed and are negative.  EKGs/Labs/Other Studies Reviewed:    The following studies were reviewed today: I discussed my findings with the patient at extensive length and reviewed hospital records.   Recent Labs: 03/19/2018: BUN 16; Creatinine, Ser 0.98; Hemoglobin 16.4; Platelets 301; Potassium 4.6; Sodium 140  Recent Lipid Panel No results found for: CHOL, TRIG, HDL, CHOLHDL, VLDL, LDLCALC, LDLDIRECT  Physical Exam:    VS:  BP (!) 144/78   Pulse 91   Ht 6' (1.829 m)   Wt (!) 308 lb 6.4 oz (139.9 kg)   SpO2 94%   BMI 41.83 kg/m     Wt Readings from Last 3 Encounters:  03/28/18 (!) 308 lb 6.4 oz (139.9 kg)  03/19/18 300 lb (136.1 kg)  03/17/18 300 lb (136.1 kg)     GEN: Patient is in no acute distress HEENT: Normal NECK: No JVD; No carotid bruits LYMPHATICS: No lymphadenopathy CARDIAC: S1 S2 regular, 2/6 systolic murmur at the apex. RESPIRATORY:  Clear to auscultation without rales, wheezing or rhonchi  ABDOMEN: Soft, non-tender,  non-distended MUSCULOSKELETAL:  No edema; No deformity  SKIN: Warm and dry NEUROLOGIC:  Alert and oriented x 3 PSYCHIATRIC:  Normal affect    Signed, Jenean Lindau, MD  03/28/2018 5:11 PM     [facility]

## 2018-03-28 NOTE — Patient Instructions (Signed)
Medication Instructions:  Your physician recommends that you continue on your current medications as directed. Please refer to the Current Medication list given to you today.   Labwork: Your physician recommends that you return for lab work today: ESR  Testing/Procedures Your physician has requested that you have an echocardiogram. Echocardiography is a painless test that uses sound waves to create images of your heart. It provides your doctor with information about the size and shape of your heart and how well your heart's chambers and valves are working. This procedure takes approximately one hour. There are no restrictions for this procedure.    Follow-Up: Your physician recommends that you schedule a follow-up appointment in: 3 weeks.   Any Other Special Instructions Will Be Listed Below (If Applicable).     If you need a refill on your cardiac medications before your next appointment, please call your pharmacy.   Echocardiogram An echocardiogram, or echocardiography, uses sound waves (ultrasound) to produce an image of your heart. The echocardiogram is simple, painless, obtained within a short period of time, and offers valuable information to your health care provider. The images from an echocardiogram can provide information such as:  Evidence of coronary artery disease (CAD).  Heart size.  Heart muscle function.  Heart valve function.  Aneurysm detection.  Evidence of a past heart attack.  Fluid buildup around the heart.  Heart muscle thickening.  Assess heart valve function.  Tell a health care provider about:  Any allergies you have.  All medicines you are taking, including vitamins, herbs, eye drops, creams, and over-the-counter medicines.  Any problems you or family members have had with anesthetic medicines.  Any blood disorders you have.  Any surgeries you have had.  Any medical conditions you have.  Whether you are pregnant or may be  pregnant. What happens before the procedure? No special preparation is needed. Eat and drink normally. What happens during the procedure?  In order to produce an image of your heart, gel will be applied to your chest and a wand-like tool (transducer) will be moved over your chest. The gel will help transmit the sound waves from the transducer. The sound waves will harmlessly bounce off your heart to allow the heart images to be captured in real-time motion. These images will then be recorded.  You may need an IV to receive a medicine that improves the quality of the pictures. What happens after the procedure? You may return to your normal schedule including diet, activities, and medicines, unless your health care provider tells you otherwise. This information is not intended to replace advice given to you by your health care provider. Make sure you discuss any questions you have with your health care provider. Document Released: 07/21/2000 Document Revised: 03/11/2016 Document Reviewed: 03/31/2013 Elsevier Interactive Patient Education  2017 [employer].

## 2018-03-29 LAB — SEDIMENTATION RATE: Sed Rate: 6 mm/hr (ref 0–15)

## 2018-04-01 ENCOUNTER — Encounter: Payer: Self-pay | Admitting: Family Medicine

## 2018-04-02 ENCOUNTER — Encounter: Payer: Self-pay | Admitting: Family Medicine

## 2018-04-02 ENCOUNTER — Ambulatory Visit: Payer: Managed Care, Other (non HMO) | Admitting: Family Medicine

## 2018-04-02 ENCOUNTER — Ambulatory Visit (HOSPITAL_BASED_OUTPATIENT_CLINIC_OR_DEPARTMENT_OTHER)
Admission: RE | Admit: 2018-04-02 | Discharge: 2018-04-02 | Disposition: A | Discharge status: Home / Self Care | Payer: Managed Care, Other (non HMO) | Source: Ambulatory Visit | Attending: Cardiology | Admitting: Cardiology

## 2018-04-02 VITALS — BP 144/102 | HR 92 | Temp 98.6°F | Wt 308.0 lb

## 2018-04-02 DIAGNOSIS — E6609 Other obesity due to excess calories: Secondary | ICD-10-CM | POA: Diagnosis not present

## 2018-04-02 DIAGNOSIS — E66811 Obesity, class 1: Secondary | ICD-10-CM

## 2018-04-02 DIAGNOSIS — R0782 Intercostal pain: Secondary | ICD-10-CM | POA: Diagnosis not present

## 2018-04-02 DIAGNOSIS — R079 Chest pain, unspecified: Secondary | ICD-10-CM

## 2018-04-02 DIAGNOSIS — I10 Essential (primary) hypertension: Secondary | ICD-10-CM | POA: Insufficient documentation

## 2018-04-02 DIAGNOSIS — Z6830 Body mass index (BMI) 30.0-30.9, adult: Secondary | ICD-10-CM

## 2018-04-02 DIAGNOSIS — J4521 Mild intermittent asthma with (acute) exacerbation: Secondary | ICD-10-CM | POA: Diagnosis not present

## 2018-04-02 MED ORDER — FLUTICASONE PROPIONATE 50 MCG/ACT NA SUSP: 2.0000 | Freq: Every day | NASAL | 6 refills | Status: DC

## 2018-04-02 NOTE — Progress Notes (Signed)
Patient: Cory Warren Male    DOB: 02-08-1991   27 y.o.   MRN: [id number] Visit Date: 04/02/2018  Today's Provider: Megan Mansichard Traci Plemons Jr, MD   Chief Complaint  Patient presents with  . Sinusitis    started two days ago.  . Chest Pain   Subjective:    Sinusitis  This is a new problem. The current episode started in the past 7 days. The problem has been gradually worsening since onset. There has been no fever. Associated symptoms include congestion, headaches, neck pain, shortness of breath, sinus pressure, a sore throat and swollen glands. Pertinent negatives include no chills, coughing, diaphoresis, ear pain, hoarse voice or sneezing.  Chest Pain   This is a new problem. The current episode started 1 to 4 weeks ago. The problem has been waxing and waning. The quality of the pain is described as dull. Associated symptoms include headaches, malaise/fatigue and shortness of breath. Pertinent negatives include no abdominal pain, cough, diaphoresis, dizziness, exertional chest pressure, fever, hemoptysis, lower extremity edema, nausea, palpitations or vomiting. He has tried NSAIDs for the symptoms. The treatment provided mild relief.  he is anxious about these issues.     Allergies  Allergen Reactions  . Biaxin [Clarithromycin] Other (See Comments)    Happen when was a child; has been able to take Z Pak     Current Outpatient Medications:  .  hydrochlorothiazide (HYDRODIURIL) 25 MG tablet, Take 1 tablet (25 mg total) by mouth daily., Disp: 30 tablet, Rfl: 0 .  Multiple Vitamin (MULTIVITAMIN WITH MINERALS) TABS tablet, Take 1 tablet by mouth daily., Disp: , Rfl:  .  VENTOLIN HFA 108 (90 Base) MCG/ACT inhaler, Inhale 1-2 puffs into the lungs every 4 (four) hours as needed for wheezing or shortness of breath., Disp: 108 g, Rfl: 11 .  amphetamine-dextroamphetamine (ADDERALL) 20 MG tablet, Take 1 tablet (20 mg total) by mouth 2 (two) times daily. (Patient not taking: Reported on  03/19/2018), Disp: 60 tablet, Rfl: 0 .  montelukast (SINGULAIR) 10 MG tablet, TAKE 1 TABLET BY MOUTH  EVERY DAY (Patient not taking: Reported on 04/02/2018), Disp: 90 tablet, Rfl: 3  Current Facility-Administered Medications:  .  levalbuterol (XOPENEX) nebulizer solution 1.25 mg, 1.25 mg, Nebulization, Once, Cory Warren, Cory Warren., MD  Review of Systems  Constitutional: Positive for fatigue and malaise/fatigue. Negative for activity change, appetite change, chills, diaphoresis, fever and unexpected weight change.  HENT: Positive for congestion, postnasal drip, rhinorrhea, sinus pressure, sinus pain, sore throat and trouble swallowing. Negative for ear discharge, ear pain, hoarse voice, sneezing and tinnitus.   Eyes: Negative.   Respiratory: Positive for chest tightness, shortness of breath and wheezing. Negative for apnea, cough, hemoptysis and stridor.   Cardiovascular: Negative for chest pain and palpitations.  Gastrointestinal: Positive for constipation. Negative for abdominal distention, abdominal pain, anal bleeding, blood in stool, diarrhea, nausea, rectal pain and vomiting.  Musculoskeletal: Positive for neck pain. Negative for neck stiffness.  Neurological: Positive for light-headedness and headaches. Negative for dizziness.  Hematological: Positive for adenopathy.    Social History   Tobacco Use  . Smoking status: Never Smoker  . Smokeless tobacco: Never Used  Substance Use Topics  . Alcohol use: Yes    Alcohol/week: 0.0 standard drinks    Comment: occasionally   Objective:   BP (!) 144/102 (BP Location: Right Arm, Patient Position: Sitting, Cuff Size: Large)   Pulse 92   Temp 98.6 F (37 C) (Oral)   Wt Marland Kitchen(!)  308 lb (139.7 kg)   SpO2 98%   BMI 41.77 kg/m  Vitals:   04/02/18 1519  BP: (!) 144/102  Pulse: 92  Temp: 98.6 F (37 C)  TempSrc: Oral  SpO2: 98%  Weight: (!) 308 lb (139.7 kg)     Physical Exam  Constitutional: He is oriented to person, place, and time.  He appears well-developed and well-nourished.  HENT:  Head: Normocephalic and atraumatic.  Eyes: Pupils are equal, round, and reactive to light.  Cardiovascular: Normal rate and regular rhythm.  Pulmonary/Chest: Effort normal and breath sounds normal.  Abdominal: Soft.  Musculoskeletal: Normal range of motion.       Right lower leg: Normal.       Left lower leg: Normal.  Neurological: He is alert and oriented to person, place, and time.  Skin: Skin is warm and dry.  Psychiatric: He has a normal mood and affect. His behavior is normal.        Assessment & Plan:     Chest Pain/costochondritis Cardiology consult underway. Obesity HTN AR/Asthma Rx Flonase/Symbicort RTC 1 week.     I have done the exam and reviewed the above chart and it is accurate to the best of my knowledge. Dentist has been used in this note in any air is in the dictation or transcription are unintentional.  Cory Mans, MD  [facility]

## 2018-04-02 NOTE — Progress Notes (Signed)
  Echocardiogram 2D Echocardiogram has been performed.  Torell Minder T Starlena Beil 04/02/2018, 1:24 PM

## 2018-04-09 ENCOUNTER — Telehealth: Payer: Self-pay | Admitting: Family Medicine

## 2018-04-09 ENCOUNTER — Ambulatory Visit: Payer: Managed Care, Other (non HMO) | Admitting: Family Medicine

## 2018-04-09 VITALS — BP 140/82 | HR 85 | Temp 98.1°F | Resp 16 | Wt 306.0 lb

## 2018-04-09 DIAGNOSIS — F419 Anxiety disorder, unspecified: Secondary | ICD-10-CM | POA: Diagnosis not present

## 2018-04-09 DIAGNOSIS — R1310 Dysphagia, unspecified: Secondary | ICD-10-CM

## 2018-04-09 DIAGNOSIS — R221 Localized swelling, mass and lump, neck: Secondary | ICD-10-CM | POA: Diagnosis not present

## 2018-04-09 DIAGNOSIS — M940 Chondrocostal junction syndrome [Tietze]: Secondary | ICD-10-CM

## 2018-04-09 DIAGNOSIS — R0782 Intercostal pain: Secondary | ICD-10-CM

## 2018-04-09 MED ORDER — SERTRALINE HCL 50 MG PO TABS: 50.0000 mg | ORAL_TABLET | Freq: Every day | ORAL | 3 refills | Status: DC

## 2018-04-09 MED ORDER — KETOROLAC TROMETHAMINE 60 MG/2ML IM SOLN
60.0000 mg | Freq: Once | INTRAMUSCULAR | Status: AC
  Administered 2018-04-09: 60 mg via INTRAMUSCULAR

## 2018-04-09 MED ORDER — ALPRAZOLAM 0.5 MG PO TABS: 0.5000 mg | ORAL_TABLET | Freq: Three times a day (TID) | ORAL | 1 refills | Status: DC | PRN

## 2018-04-09 NOTE — Telephone Encounter (Signed)
Please review. Thanks!  

## 2018-04-09 NOTE — Telephone Encounter (Signed)
Pt called and was just in this morning to see Dr. Sullivan Lone.  After he left he realized he never really got an answer about his throat being swollen and if Dr . Sullivan Lone was going to refer him to an ENT.  Please advise  [phone]  Thanks Cory Warren

## 2018-04-09 NOTE — Telephone Encounter (Signed)
Stop for now

## 2018-04-09 NOTE — Telephone Encounter (Signed)
Pt called back later and ask also about chest inflammation asking do you want him to continue the Advil that he had already been taking.  Thanks [employer]

## 2018-04-09 NOTE — Telephone Encounter (Signed)
Advised patient as below. Patient wanted you to know that he does have the inflammation in his chest. Will this go away on its own? Will the injection that he received in the office take care of this? Please advise.

## 2018-04-09 NOTE — Progress Notes (Signed)
Cory Warren  MRN: [id number] DOB: Nov 02, 1990  Subjective:  HPI   The patient is a 27 year old male who presents for follow up of his Allergic Rhinitis and Asthma.  He was seen 1 week ago and started on Symbicort.  He states that it seems to be helping some.  He does however, state that he is still having chest pain.  He can reproduce the pain just by touch.  He also states that he feels as those his throat is swelling and he is having difficulty swallowing.  Said that he has been unable to sleep because his throat feels tight and he seems to be having anxiety over his symptoms.  He further states that at times he thinks that he is dying because the symptoms are so bad.  He also feels that he may have an overall feeling that he is dying.  He has not been able to sleep for the last 3 nights and said at one point he felt like his heart was beating really slow despite checking his heart rate and it was normal.   He is not convinced that he does not have something wrong with his heart and feels he is just getting worse. He said all of his symptoms especially the throat symptoms are worse at night.  He is unable to lay flat because of the throat feeling like it is closing.   At times he feels he is "super tight all over".  These are the worst times he has and when this happens he feels he has the chest pain and that the pain goes to the back of his neck.      Patient Active Problem List   Diagnosis Date Noted  . Chest pain 03/28/2018  . Overweight 03/28/2018  . Derangement of knee 08/03/2017  . Radial styloid tenosynovitis 08/03/2017  . ADD (attention deficit disorder) 07/21/2015  . Problems influencing health status 07/21/2015  . Airway hyperreactivity 07/21/2015  . Adiposity 07/21/2015  . Migraine 07/21/2015    Past Medical History:  Diagnosis Date  . Asthma   . Hypertension     Social History   Socioeconomic History  . Marital status: Single    Spouse name: Not on file    . Number of children: Not on file  . Years of education: Not on file  . Highest education level: Not on file  Occupational History  . Not on file  Social Needs  . Financial resource strain: Not on file  . Food insecurity:    Worry: Not on file    Inability: Not on file  . Transportation needs:    Medical: Not on file    Non-medical: Not on file  Tobacco Use  . Smoking status: Never Smoker  . Smokeless tobacco: Never Used  Substance and Sexual Activity  . Alcohol use: Yes    Alcohol/week: 0.0 standard drinks    Comment: occasionally  . Drug use: Yes    Types: Marijuana    Comment: last used 1 1/2 weeks ago.  Marland Kitchen Sexual activity: Not on file  Lifestyle  . Physical activity:    Days per week: Not on file    Minutes per session: Not on file  . Stress: Not on file  Relationships  . Social connections:    Talks on phone: Not on file    Gets together: Not on file    Attends religious service: Not on file    Active member of club or  organization: Not on file    Attends meetings of clubs or organizations: Not on file    Relationship status: Not on file  . Intimate partner violence:    Fear of current or ex partner: Not on file    Emotionally abused: Not on file    Physically abused: Not on file    Forced sexual activity: Not on file  Other Topics Concern  . Not on file  Social History Narrative  . Not on file    Outpatient Encounter Medications as of 04/09/2018  Medication Sig  . hydrochlorothiazide (HYDRODIURIL) 25 MG tablet Take 1 tablet (25 mg total) by mouth daily.  . Multiple Vitamin (MULTIVITAMIN WITH MINERALS) TABS tablet Take 1 tablet by mouth daily.  . VENTOLIN HFA 108 (90 Base) MCG/ACT inhaler Inhale 1-2 puffs into the lungs every 4 (four) hours as needed for wheezing or shortness of breath.  . amphetamine-dextroamphetamine (ADDERALL) 20 MG tablet Take 1 tablet (20 mg total) by mouth 2 (two) times daily. (Patient not taking: Reported on 03/19/2018)  . fluticasone  (FLONASE) 50 MCG/ACT nasal spray Place 2 sprays into both nostrils daily. (Patient not taking: Reported on 04/09/2018)  . montelukast (SINGULAIR) 10 MG tablet TAKE 1 TABLET BY MOUTH  EVERY DAY (Patient not taking: Reported on 04/02/2018)   Facility-Administered Encounter Medications as of 04/09/2018  Medication  . levalbuterol (XOPENEX) nebulizer solution 1.25 mg    Allergies  Allergen Reactions  . Biaxin [Clarithromycin] Other (See Comments)    Happen when was a child; has been able to take Z Pak    Review of Systems  Constitutional: Negative for fever and malaise/fatigue.  HENT: Negative for congestion, ear discharge, ear pain, nosebleeds, sinus pain, sore throat and tinnitus.   Eyes: Negative.   Respiratory: Positive for shortness of breath and wheezing. Negative for cough.   Cardiovascular: Positive for chest pain. Negative for palpitations.  Gastrointestinal: Negative.   Genitourinary: Negative.   Musculoskeletal: Positive for myalgias and neck pain. Negative for back pain and joint pain.  Psychiatric/Behavioral: The patient is nervous/anxious and has insomnia.     Objective:  BP 140/82 (BP Location: Right Arm, Patient Position: Sitting, Cuff Size: Large)   Pulse 85   Temp 98.1 F (36.7 C) (Oral)   Resp 16   Wt (!) 306 lb (138.8 kg)   BMI 41.50 kg/m   Physical Exam  Constitutional: He is oriented to person, place, and time and well-developed, well-nourished, and in no distress.  HENT:  Head: Normocephalic and atraumatic.  Right Ear: External ear normal.  Left Ear: External ear normal.  Nose: Nose normal.  Mouth/Throat: Oropharynx is clear and moist.  Eyes: Conjunctivae are normal. No scleral icterus.  Neck: No thyromegaly present.  Cardiovascular: Normal rate, regular rhythm and normal heart sounds.  Pulmonary/Chest: Effort normal and breath sounds normal.  Musculoskeletal: He exhibits tenderness. He exhibits no edema.  Tender over costochondral junction ,right >left.    Lymphadenopathy:    He has no cervical adenopathy.  Neurological: He is alert and oriented to person, place, and time. Gait normal. GCS score is 15.  Skin: Skin is warm and dry.  Psychiatric: Mood, memory, affect and judgment normal.   Epworth is 8 Assessment and Plan :  1. Costochondritis  - ketorolac (TORADOL) injection 60 mg  2. Dysphagia, unspecified type May also need GI referral.  3. Anxiety Chronic issue but much worse recently. RTC 1 week.  - sertraline (ZOLOFT) 50 MG tablet; Take 1 tablet (50 mg  total) by mouth daily.  Dispense: 30 tablet; Refill: 3 - ALPRAZolam (XANAX) 0.5 MG tablet; Take 1 tablet (0.5 mg total) by mouth 3 (three) times daily as needed for anxiety.  Dispense: 90 tablet; Refill: 1  4. Throat swelling Pt convinced he has enough swelling that his throat closes at night when he lies down. Exam normal today--refer to ENT for pt. - Ambulatory referral to ENT 5.Chest Pain CT offered but I do not think it is necessary.Cardiology w/u normal. Oxygen normal as is exam. 6. Obesity Consider sleep study.Epworth is 8.  I have done the exam and reviewed the chart and it is accurate to the best of my knowledge. Dentist has been used and  any errors in dictation or transcription are unintentional. Julieanne Manson M.D. [facility]

## 2018-04-10 ENCOUNTER — Other Ambulatory Visit: Payer: Self-pay

## 2018-04-10 ENCOUNTER — Telehealth: Payer: Self-pay | Admitting: Cardiology

## 2018-04-10 ENCOUNTER — Telehealth: Payer: Self-pay

## 2018-04-10 DIAGNOSIS — R079 Chest pain, unspecified: Secondary | ICD-10-CM

## 2018-04-10 NOTE — Telephone Encounter (Signed)
Left voicemail informing patient that order for cardiac MRI would be placed due to chest discomfort and suspected pericarditis. Message has been sent for pre-cert and scheduling as high priority per the request of Dr. Tomie China.

## 2018-04-10 NOTE — Telephone Encounter (Signed)
Called patient and LVM to call me back with time of day he would like cardiac MRI.  His home number was not working.

## 2018-04-10 NOTE — Telephone Encounter (Signed)
The shot should help. On Friday start aleve 2 with breakfast and 2 with dinner.

## 2018-04-10 NOTE — Telephone Encounter (Signed)
Advised patient as below.  

## 2018-04-17 ENCOUNTER — Ambulatory Visit: Payer: Self-pay | Admitting: Family Medicine

## 2018-04-17 ENCOUNTER — Ambulatory Visit: Payer: Managed Care, Other (non HMO) | Admitting: Cardiology

## 2018-05-06 ENCOUNTER — Ambulatory Visit: Payer: Managed Care, Other (non HMO) | Admitting: Family Medicine

## 2018-06-11 ENCOUNTER — Other Ambulatory Visit: Payer: Self-pay | Admitting: Family Medicine

## 2018-06-11 DIAGNOSIS — J4520 Mild intermittent asthma, uncomplicated: Secondary | ICD-10-CM

## 2018-06-12 ENCOUNTER — Telehealth: Payer: Self-pay | Admitting: Family Medicine

## 2018-06-12 DIAGNOSIS — F419 Anxiety disorder, unspecified: Secondary | ICD-10-CM

## 2018-06-12 NOTE — Telephone Encounter (Signed)
Advised pharmacy.

## 2018-06-12 NOTE — Telephone Encounter (Signed)
We received a fax from [facility] stating that the patient's plan requires sertraline (ZOLOFT) 50 MG tablet be filled for a 90 day supply.  Please advise.

## 2018-06-12 NOTE — Telephone Encounter (Signed)
Please review. Thanks!  

## 2018-06-12 NOTE — Telephone Encounter (Signed)
30 days RF until he has f/u appt.

## 2018-06-14 ENCOUNTER — Other Ambulatory Visit: Payer: Self-pay | Admitting: Family Medicine

## 2018-06-14 DIAGNOSIS — F419 Anxiety disorder, unspecified: Secondary | ICD-10-CM

## 2018-06-14 MED ORDER — SERTRALINE HCL 50 MG PO TABS: 50.0000 mg | ORAL_TABLET | Freq: Every day | ORAL | 0 refills | Status: DC

## 2018-06-14 NOTE — Telephone Encounter (Signed)
Please verify which pharmacy he wants to use.

## 2018-06-14 NOTE — Telephone Encounter (Signed)
sertraline (ZOLOFT) 50 MG tablet  Rx was written for 60 tablets but pharmacy cannot fill medication unless written for  90 day supply before insurance will fill medication. Pt is going out of town this weekend and needing the refill.   Please advise.   Thanks, [employer]

## 2018-06-14 NOTE — Telephone Encounter (Signed)
Per Dr. Sullivan Lone, patient is to only have a 30 day supply until he makes an follow up appt in the office. No further refills until he is seen. Patient is aware.

## 2018-06-14 NOTE — Telephone Encounter (Signed)
Pt calling back to find out about medication refill on Zoloft.  Pt has been without Rx for a few days.  Please call pt back before end of day to let him know status.  Thanks, [employer]

## 2018-06-14 NOTE — Telephone Encounter (Signed)
Please review for Dr. Gilbert  Thanks,   -Taylah Dubiel  

## 2018-06-17 NOTE — Telephone Encounter (Signed)
Medication was sent in for only 30days last week.

## 2018-06-27 ENCOUNTER — Ambulatory Visit: Payer: Managed Care, Other (non HMO) | Admitting: Family Medicine

## 2018-06-27 ENCOUNTER — Encounter: Payer: Self-pay | Admitting: Family Medicine

## 2018-06-27 VITALS — BP 126/82 | HR 86 | Temp 98.2°F | Resp 16 | Ht 72.0 in | Wt 326.0 lb

## 2018-06-27 DIAGNOSIS — F419 Anxiety disorder, unspecified: Secondary | ICD-10-CM

## 2018-06-27 DIAGNOSIS — J4520 Mild intermittent asthma, uncomplicated: Secondary | ICD-10-CM

## 2018-06-27 MED ORDER — SERTRALINE HCL 50 MG PO TABS: 50.0000 mg | ORAL_TABLET | Freq: Every day | ORAL | 5 refills | Status: DC

## 2018-06-27 MED ORDER — ALPRAZOLAM 0.5 MG PO TABS: 0.5000 mg | ORAL_TABLET | Freq: Three times a day (TID) | ORAL | 1 refills | Status: DC | PRN

## 2018-06-27 MED ORDER — ALBUTEROL SULFATE HFA 108 (90 BASE) MCG/ACT IN AERS: 1.0000 | INHALATION_SPRAY | Freq: Four times a day (QID) | RESPIRATORY_TRACT | 5 refills | Status: DC | PRN

## 2018-06-27 MED ORDER — BUDESONIDE-FORMOTEROL FUMARATE 80-4.5 MCG/ACT IN AERO: 2.0000 | INHALATION_SPRAY | Freq: Two times a day (BID) | RESPIRATORY_TRACT | 3 refills | Status: DC

## 2018-06-27 NOTE — Progress Notes (Signed)
Patient: Cory Warren Male    DOB: Apr 15, 1991   27 y.o.   MRN: [id number] Visit Date: 06/27/2018  Today's Provider: Megan Mans, MD   Chief Complaint  Patient presents with  . Follow-up   Subjective:    HPI Patient comes in today for a follow up. He was last seen in the office 2 months ago. Patient reports that he needs refills on medications. He reports that both Zoloft and Alprazolam works well with his anxiety and depression.    He also reports that his chest pain has resolved. He feels that his symptoms were anxiety related.    Allergies  Allergen Reactions  . Biaxin [Clarithromycin] Other (See Comments)    Happen when was a child; has been able to take Z Pak     Current Outpatient Medications:  .  albuterol (PROVENTIL HFA;VENTOLIN HFA) 108 (90 Base) MCG/ACT inhaler, INHALE 2 PUFFS BY MOUTH EVERY 4 HOURS AS NEEDED FOR WHEEZING, Disp: 54 g, Rfl: 5 .  ALPRAZolam (XANAX) 0.5 MG tablet, Take 1 tablet (0.5 mg total) by mouth 3 (three) times daily as needed for anxiety., Disp: 90 tablet, Rfl: 1 .  hydrochlorothiazide (HYDRODIURIL) 25 MG tablet, Take 1 tablet (25 mg total) by mouth daily., Disp: 30 tablet, Rfl: 0 .  amphetamine-dextroamphetamine (ADDERALL) 20 MG tablet, Take 1 tablet (20 mg total) by mouth 2 (two) times daily. (Patient not taking: Reported on 03/19/2018), Disp: 60 tablet, Rfl: 0 .  fluticasone (FLONASE) 50 MCG/ACT nasal spray, Place 2 sprays into both nostrils daily. (Patient not taking: Reported on 04/09/2018), Disp: 16 g, Rfl: 6 .  montelukast (SINGULAIR) 10 MG tablet, TAKE 1 TABLET BY MOUTH  EVERY DAY (Patient not taking: Reported on 04/02/2018), Disp: 90 tablet, Rfl: 3 .  Multiple Vitamin (MULTIVITAMIN WITH MINERALS) TABS tablet, Take 1 tablet by mouth daily., Disp: , Rfl:  .  sertraline (ZOLOFT) 50 MG tablet, Take 1 tablet (50 mg total) by mouth daily., Disp: 30 tablet, Rfl: 0  Current Facility-Administered Medications:  .  levalbuterol  (XOPENEX) nebulizer solution 1.25 mg, 1.25 mg, Nebulization, Once, Maple Hudson., MD  Review of Systems  Constitutional: Negative.   Respiratory: Negative for cough and shortness of breath.   Cardiovascular: Negative for chest pain, palpitations and leg swelling.  Endocrine: Negative.   Musculoskeletal: Negative for arthralgias and back pain.  Neurological: Negative for dizziness, light-headedness and headaches.  Psychiatric/Behavioral: Negative for agitation, self-injury, sleep disturbance and suicidal ideas. The patient is not nervous/anxious.     Social History   Tobacco Use  . Smoking status: Never Smoker  . Smokeless tobacco: Never Used  Substance Use Topics  . Alcohol use: Yes    Alcohol/week: 0.0 standard drinks    Comment: occasionally   Objective:   BP 126/82 (BP Location: Left Arm, Patient Position: Sitting, Cuff Size: Large)   Pulse 86   Temp 98.2 F (36.8 C)   Resp 16   Ht 6' (1.829 m)   Wt (!) 326 lb (147.9 kg)   SpO2 96%   BMI 44.21 kg/m  Vitals:   06/27/18 0823  BP: 126/82  Pulse: 86  Resp: 16  Temp: 98.2 F (36.8 C)  SpO2: 96%  Weight: (!) 326 lb (147.9 kg)  Height: 6' (1.829 m)     Physical Exam  Constitutional: He is oriented to person, place, and time. He appears well-developed and well-nourished.  Obes WM NAD.  HENT:  Head: Normocephalic and atraumatic.  Eyes: Conjunctivae are normal. No scleral icterus.  Neck: No thyromegaly present.  Cardiovascular: Normal rate, regular rhythm and normal heart sounds.  Pulmonary/Chest: Effort normal and breath sounds normal.  Abdominal: Soft.  Musculoskeletal: He exhibits no edema.  Neurological: He is alert and oriented to person, place, and time.  Skin: Skin is warm and dry.  Psychiatric: He has a normal mood and affect. His behavior is normal. Judgment and thought content normal.        Assessment & Plan:     1. Anxiety Much better in recent weeks. - sertraline (ZOLOFT) 50 MG  tablet; Take 1 tablet (50 mg total) by mouth daily.  Dispense: 30 tablet; Refill: 5 - ALPRAZolam (XANAX) 0.5 MG tablet; Take 1 tablet (0.5 mg total) by mouth 3 (three) times daily as needed for anxiety.  Dispense: 90 tablet; Refill: 1  2. Intermittent asthma without complication, unspecified asthma severity  - albuterol (PROVENTIL HFA;VENTOLIN HFA) 108 (90 Base) MCG/ACT inhaler; Inhale 1-2 puffs into the lungs every 6 (six) hours as needed for wheezing or shortness of breath.  Dispense: 54 g; Refill: 5 - budesonide-formoterol (SYMBICORT) 80-4.5 MCG/ACT inhaler; Inhale 2 puffs into the lungs 2 (two) times daily.  Dispense: 1 Inhaler; Refill: 3 3.Morbid Obesity Lifestyle stressed.     I have done the exam and reviewed the above chart and it is accurate to the best of my knowledge. DentistDragon  technology has been used in this note in any air is in the dictation or transcription are unintentional.  Megan Mansichard Deveon Kisiel Jr, MD  [facility]

## 2018-07-11 ENCOUNTER — Ambulatory Visit: Payer: Managed Care, Other (non HMO) | Admitting: Family Medicine

## 2018-07-11 ENCOUNTER — Other Ambulatory Visit: Payer: Self-pay

## 2018-07-11 ENCOUNTER — Encounter: Payer: Self-pay | Admitting: Family Medicine

## 2018-07-11 VITALS — BP 144/90 | HR 97 | Temp 98.2°F | Ht 71.0 in | Wt 325.2 lb

## 2018-07-11 DIAGNOSIS — J069 Acute upper respiratory infection, unspecified: Secondary | ICD-10-CM

## 2018-07-11 DIAGNOSIS — J4530 Mild persistent asthma, uncomplicated: Secondary | ICD-10-CM | POA: Diagnosis not present

## 2018-07-11 MED ORDER — PREDNISONE 20 MG PO TABS: ORAL_TABLET | ORAL | 1 refills | Status: DC

## 2018-07-11 NOTE — Patient Instructions (Addendum)
Discussed use of Mucinex for congestion and Delsym for cough.Continue to schedule albuterol while ill every 4-6 hours.

## 2018-07-11 NOTE — Progress Notes (Signed)
  Subjective:     Patient ID: Cory Warren, male   DOB: 05-Nov-1990, 27 y.o.   MRN: [id number] Chief Complaint  Patient presents with  . Shortness of Breath    pt has asthma and states it is hard to breathe at times.  since 07/09/18   HPI Developed wheezing and increased shortness of breath with onset of mild cold sx. Reports compliance with Symbicort and increased use of albuterol rescue inhaler.  Review of Systems     Objective:   Physical Exam  Constitutional: He appears well-developed and well-nourished. He does not appear ill.  Ears: T.M's intact without inflammation Throat: no tonsillar enlargement or exudate Neck: no cervical adenopathy Lungs: Bilateral expiratory wheezes.     Assessment:    1. URI, acute  2. Mild persistent asthma without complication - predniSONE (DELTASONE) 20 MG tablet; One pill twice daily for 5 days  Dispense: 10 tablet; Refill: 1    Plan:    Start Mucinex and Delsym along with oral steroids and scheduled albuterol.

## 2018-07-17 ENCOUNTER — Ambulatory Visit: Payer: Managed Care, Other (non HMO) | Admitting: Physician Assistant

## 2018-07-17 ENCOUNTER — Encounter: Payer: Self-pay | Admitting: Physician Assistant

## 2018-07-17 VITALS — BP 169/98 | HR 83 | Temp 98.7°F | Resp 20 | Wt 320.0 lb

## 2018-07-17 DIAGNOSIS — J4520 Mild intermittent asthma, uncomplicated: Secondary | ICD-10-CM

## 2018-07-17 DIAGNOSIS — J4541 Moderate persistent asthma with (acute) exacerbation: Secondary | ICD-10-CM

## 2018-07-17 MED ORDER — PREDNISONE 10 MG PO TABS: ORAL_TABLET | ORAL | 0 refills | Status: DC

## 2018-07-17 MED ORDER — BENZONATATE 200 MG PO CAPS: 200.0000 mg | ORAL_CAPSULE | Freq: Three times a day (TID) | ORAL | 0 refills | Status: DC | PRN

## 2018-07-17 MED ORDER — AMOXICILLIN-POT CLAVULANATE 875-125 MG PO TABS: 1.0000 | ORAL_TABLET | Freq: Two times a day (BID) | ORAL | 0 refills | Status: DC

## 2018-07-17 MED ORDER — ALBUTEROL SULFATE HFA 108 (90 BASE) MCG/ACT IN AERS: 1.0000 | INHALATION_SPRAY | Freq: Four times a day (QID) | RESPIRATORY_TRACT | 5 refills | Status: DC | PRN

## 2018-07-17 NOTE — Patient Instructions (Signed)

## 2018-07-17 NOTE — Progress Notes (Signed)
Patient: Cory Warren Male    DOB: Nov 14, 1990   27 y.o.   MRN: [id number] Visit Date: 07/17/2018  Today's Provider: Margaretann Loveless, PA-C   Chief Complaint  Patient presents with  . URI   Subjective:    HPI Upper Respiratory Infection: Patient complains of symptoms of a URI, possible sinusitis. Symptoms include congestion and cough. Onset of symptoms was 9 days ago, gradually worsening since that time. He also c/o shortness of breath, sore throat and wheezing for the past 1 week .  He is drinking plenty of fluids. Evaluation to date: seen previously and thought to have a viral URI. Treatment to date: prednisone. Patient does have h/o asthma.     Allergies  Allergen Reactions  . Biaxin [Clarithromycin] Other (See Comments)    Happen when was a child; has been able to take Z Pak     Current Outpatient Medications:  .  albuterol (PROVENTIL HFA;VENTOLIN HFA) 108 (90 Base) MCG/ACT inhaler, Inhale 1-2 puffs into the lungs every 6 (six) hours as needed for wheezing or shortness of breath., Disp: 54 g, Rfl: 5 .  ALPRAZolam (XANAX) 0.5 MG tablet, Take 1 tablet (0.5 mg total) by mouth 3 (three) times daily as needed for anxiety., Disp: 90 tablet, Rfl: 1 .  budesonide-formoterol (SYMBICORT) 80-4.5 MCG/ACT inhaler, Inhale 2 puffs into the lungs 2 (two) times daily., Disp: 1 Inhaler, Rfl: 3 .  montelukast (SINGULAIR) 10 MG tablet, TAKE 1 TABLET BY MOUTH  EVERY DAY, Disp: 90 tablet, Rfl: 3 .  Multiple Vitamin (MULTIVITAMIN WITH MINERALS) TABS tablet, Take 1 tablet by mouth daily., Disp: , Rfl:  .  sertraline (ZOLOFT) 50 MG tablet, Take 1 tablet (50 mg total) by mouth daily., Disp: 30 tablet, Rfl: 5  Review of Systems  Constitutional: Negative.   HENT: Positive for congestion, postnasal drip and sore throat. Negative for ear pain, rhinorrhea, sinus pressure and sinus pain.   Respiratory: Positive for cough, chest tightness, shortness of breath and wheezing.   Cardiovascular:  Negative.     Social History   Tobacco Use  . Smoking status: Never Smoker  . Smokeless tobacco: Never Used  Substance Use Topics  . Alcohol use: Yes    Alcohol/week: 0.0 standard drinks    Comment: occasionally   Objective:   BP (!) 169/98 (BP Location: Left Arm, Patient Position: Sitting, Cuff Size: Large)   Pulse 83   Temp 98.7 F (37.1 C) (Oral)   Resp 20   Wt (!) 320 lb (145.2 kg)   SpO2 97%   BMI 44.63 kg/m  Vitals:   07/17/18 1748  BP: (!) 169/98  Pulse: 83  Resp: 20  Temp: 98.7 F (37.1 C)  TempSrc: Oral  SpO2: 97%  Weight: (!) 320 lb (145.2 kg)     Physical Exam  Constitutional: He appears well-developed and well-nourished. No distress.  HENT:  Head: Normocephalic and atraumatic.  Right Ear: Hearing, tympanic membrane, external ear and ear canal normal. Tympanic membrane is not erythematous and not bulging. No middle ear effusion.  Left Ear: Hearing, tympanic membrane, external ear and ear canal normal. Tympanic membrane is not erythematous and not bulging.  No middle ear effusion.  Nose: No mucosal edema or rhinorrhea. Right sinus exhibits no maxillary sinus tenderness and no frontal sinus tenderness. Left sinus exhibits no maxillary sinus tenderness and no frontal sinus tenderness.  Mouth/Throat: Uvula is midline and mucous membranes are normal. Posterior oropharyngeal erythema present. No oropharyngeal exudate  or posterior oropharyngeal edema.  Eyes: Pupils are equal, round, and reactive to light. Conjunctivae and EOM are normal. Right eye exhibits no discharge. Left eye exhibits no discharge.  Neck: Normal range of motion. Neck supple. No tracheal deviation present. No Brudzinski's sign and no Kernig's sign noted. No thyromegaly present.  Cardiovascular: Normal rate, regular rhythm and normal heart sounds. Exam reveals no gallop and no friction rub.  No murmur heard. Pulmonary/Chest: Effort normal. No stridor. No respiratory distress. He has wheezes  (throughout). He has no rales.  Lymphadenopathy:    He has no cervical adenopathy.  Skin: Skin is warm and dry. He is not diaphoretic.  Vitals reviewed.       Assessment & Plan:     1. Moderate persistent asthmatic bronchitis with acute exacerbation Worsening. Will treat with augmentin, prednisone and tessalon perles. Push fluids. Continue current asthma treatments (inahlers). Call if worsening.  - predniSONE (DELTASONE) 10 MG tablet; Take 6 tabs PO on day 1&2, 5 tabs PO on day 3&4, 4 tabs PO on day 5&6, 3 tabs PO on day 7&8, 2 tabs PO on day 9&10, 1 tab PO on day 11&12.  Dispense: 42 tablet; Refill: 0 - amoxicillin-clavulanate (AUGMENTIN) 875-125 MG tablet; Take 1 tablet by mouth 2 (two) times daily.  Dispense: 20 tablet; Refill: 0 - benzonatate (TESSALON) 200 MG capsule; Take 1 capsule (200 mg total) by mouth 3 (three) times daily as needed for cough.  Dispense: 30 capsule; Refill: 0  2. Intermittent asthma without complication, unspecified asthma severity Stable. Diagnosis pulled for medication refill. Continue current medical treatment plan. - albuterol (PROVENTIL HFA;VENTOLIN HFA) 108 (90 Base) MCG/ACT inhaler; Inhale 1-2 puffs into the lungs every 6 (six) hours as needed for wheezing or shortness of breath.  Dispense: 54 g; Refill: 5  The entirety of the information documented in the History of Present Illness, Review of Systems and Physical Exam were personally obtained by me. Portions of this information were initially documented by Rollen SoxSuli Dimas, CMA and reviewed by me for thoroughness and accuracy.      Margaretann LovelessJennifer M Dreydon Cardenas, PA-C  [facility]

## 2018-08-14 ENCOUNTER — Ambulatory Visit: Payer: Managed Care, Other (non HMO) | Admitting: Family Medicine

## 2018-08-14 ENCOUNTER — Encounter: Payer: Self-pay | Admitting: Family Medicine

## 2018-08-14 VITALS — BP 147/90 | HR 92 | Temp 98.6°F | Resp 16 | Wt 322.0 lb

## 2018-08-14 DIAGNOSIS — Z6841 Body Mass Index (BMI) 40.0 and over, adult: Secondary | ICD-10-CM | POA: Diagnosis not present

## 2018-08-14 DIAGNOSIS — J4530 Mild persistent asthma, uncomplicated: Secondary | ICD-10-CM

## 2018-08-14 DIAGNOSIS — F419 Anxiety disorder, unspecified: Secondary | ICD-10-CM

## 2018-08-14 MED ORDER — ALPRAZOLAM 0.5 MG PO TABS: 0.5000 mg | ORAL_TABLET | Freq: Three times a day (TID) | ORAL | 1 refills | Status: DC | PRN

## 2018-08-14 NOTE — Progress Notes (Addendum)
Patient: Cory Warren Male    DOB: [birth year]   28 y.o.   MRN: [id number] Visit Date: 08/14/2018  Today's Provider: Megan Mans, MD   Chief Complaint  Patient presents with  . Follow-up    Anxiety   Subjective:     HPI  Patient comes in today for a follow up Anxiety.   Anxiety, Follow up:  The patient was last seen for  4 months ago. Changes made since that visit include none.  He reports excellent compliance with treatment. He is not having side effects.  Patient reports that he has been having more trouble with chest tightness/chest pain, restlessness at night for the past 1-2 weeks. Patient reports he went to [facility] and was without Zoloft medicine for 2 weeks because he left it here and off the Alprazolam for 1 weeks because he run out it. Reports that he usually don't take the Alprazolam three times a day but because he didn't have the Zoloft with him he took it three times day. ------------------------------------------------------------------------ Cerumen impaction: Reports he has not been able to here from his left ear in the last couple of days and feels that he pushed the wax more in when he was cleaning it 3 days. Treatment tried: Ear wax drops used it for 3 days.   Allergies  Allergen Reactions  . Biaxin [Clarithromycin] Other (See Comments)    Happen when was a child; has been able to take Z Pak     Current Outpatient Medications:  .  albuterol (PROVENTIL HFA;VENTOLIN HFA) 108 (90 Base) MCG/ACT inhaler, Inhale 1-2 puffs into the lungs every 6 (six) hours as needed for wheezing or shortness of breath., Disp: 54 g, Rfl: 5 .  ALPRAZolam (XANAX) 0.5 MG tablet, Take 1 tablet (0.5 mg total) by mouth 3 (three) times daily as needed for anxiety., Disp: 90 tablet, Rfl: 1 .  budesonide-formoterol (SYMBICORT) 80-4.5 MCG/ACT inhaler, Inhale 2 puffs into the lungs 2 (two) times daily., Disp: 1 Inhaler, Rfl: 3 .  montelukast (SINGULAIR) 10 MG tablet,  TAKE 1 TABLET BY MOUTH  EVERY DAY, Disp: 90 tablet, Rfl: 3 .  Multiple Vitamin (MULTIVITAMIN WITH MINERALS) TABS tablet, Take 1 tablet by mouth daily., Disp: , Rfl:  .  sertraline (ZOLOFT) 50 MG tablet, Take 1 tablet (50 mg total) by mouth daily., Disp: 30 tablet, Rfl: 5 .  amoxicillin-clavulanate (AUGMENTIN) 875-125 MG tablet, Take 1 tablet by mouth 2 (two) times daily. (Patient not taking: Reported on 08/14/2018), Disp: 20 tablet, Rfl: 0 .  benzonatate (TESSALON) 200 MG capsule, Take 1 capsule (200 mg total) by mouth 3 (three) times daily as needed for cough. (Patient not taking: Reported on 08/14/2018), Disp: 30 capsule, Rfl: 0 .  predniSONE (DELTASONE) 10 MG tablet, Take 6 tabs PO on day 1&2, 5 tabs PO on day 3&4, 4 tabs PO on day 5&6, 3 tabs PO on day 7&8, 2 tabs PO on day 9&10, 1 tab PO on day 11&12. (Patient not taking: Reported on 08/14/2018), Disp: 42 tablet, Rfl: 0  Review of Systems  Constitutional: Negative.   Eyes: Negative.   Respiratory: Positive for chest tightness.   Cardiovascular: Positive for chest pain ("feels from the anxiety" he reports is manageable with the Zoloft).  Gastrointestinal: Negative.   Endocrine: Negative.   Allergic/Immunologic: Negative.   Psychiatric/Behavioral: Negative.     Social History   Tobacco Use  . Smoking status: Never Smoker  . Smokeless tobacco: Never Used  Substance Use Topics  . Alcohol use: Yes    Alcohol/week: 0.0 standard drinks    Comment: occasionally      Objective:   BP (!) 147/90 (BP Location: Left Arm, Patient Position: Sitting, Cuff Size: Large)   Pulse 92   Temp 98.6 F (37 C) (Oral)   Resp 16   Wt (!) 322 lb (146.1 kg)   BMI 44.91 kg/m  Vitals:   08/14/18 1322  BP: (!) 147/90  Pulse: 92  Resp: 16  Temp: 98.6 F (37 C)  TempSrc: Oral  Weight: (!) 322 lb (146.1 kg)     Physical Exam Constitutional:      Appearance: He is well-developed. He is obese.     Comments: Obes WM NAD.  HENT:     Head:  Normocephalic and atraumatic.     Right Ear: External ear normal.     Left Ear: External ear normal.     Nose: Nose normal.  Eyes:     General: No scleral icterus.    Conjunctiva/sclera: Conjunctivae normal.  Neck:     Thyroid: No thyromegaly.  Cardiovascular:     Rate and Rhythm: Normal rate and regular rhythm.     Heart sounds: Normal heart sounds.  Pulmonary:     Effort: Pulmonary effort is normal.     Breath sounds: Normal breath sounds.  Abdominal:     Palpations: Abdomen is soft.  Musculoskeletal:        General: No swelling.  Skin:    General: Skin is warm and dry.  Neurological:     General: No focal deficit present.     Mental Status: He is alert and oriented to person, place, and time. Mental status is at baseline.  Psychiatric:        Behavior: Behavior normal.        Thought Content: Thought content normal.        Judgment: Judgment normal.         Assessment & Plan    1. Anxiety Continue sertraline.  Take Xanax sparingly. - ALPRAZolam (XANAX) 0.5 MG tablet; Take 1 tablet (0.5 mg total) by mouth 3 (three) times daily as needed for anxiety.  Dispense: 90 tablet; Refill: 1  2. Class 3 severe obesity due to excess calories without serious comorbidity with body mass index (BMI) of 40.0 to 44.9 in adult [facility](HCC) Diet and exercise discussed at some length.  3. Mild persistent asthma without complication 4.Cerumen Impaction Removed.    I have done the exam and reviewed the above chart and it is accurate to the best of my knowledge. Dentist[employer]agon  technology has been used in this note in any air is in the dictation or transcription are unintentional.  Megan Mansichard Gilbert Jr, MD  [facility]

## 2018-10-22 ENCOUNTER — Encounter: Payer: Self-pay | Admitting: Family Medicine

## 2018-10-22 ENCOUNTER — Ambulatory Visit: Payer: Managed Care, Other (non HMO) | Admitting: Family Medicine

## 2018-10-22 ENCOUNTER — Other Ambulatory Visit: Payer: Self-pay

## 2018-10-22 VITALS — BP 126/82 | HR 84 | Temp 98.8°F | Resp 16 | Ht 71.0 in | Wt 335.0 lb

## 2018-10-22 DIAGNOSIS — J4530 Mild persistent asthma, uncomplicated: Secondary | ICD-10-CM

## 2018-10-22 DIAGNOSIS — F419 Anxiety disorder, unspecified: Secondary | ICD-10-CM

## 2018-10-22 DIAGNOSIS — J069 Acute upper respiratory infection, unspecified: Secondary | ICD-10-CM | POA: Diagnosis not present

## 2018-10-22 DIAGNOSIS — Z6841 Body Mass Index (BMI) 40.0 and over, adult: Secondary | ICD-10-CM

## 2018-10-22 MED ORDER — SERTRALINE HCL 100 MG PO TABS
100.0000 mg | ORAL_TABLET | Freq: Every day | ORAL | 3 refills | Status: AC
Start: 2018-10-22 — End: ?

## 2018-10-22 NOTE — Progress Notes (Signed)
Patient: Cory Warren Male    DOB: 28-May-1991   28 y.o.   MRN: [id number] Visit Date: 10/22/2018  Today's Provider: Megan Mans, MD   Chief Complaint  Patient presents with  . URI   Subjective:     URI   This is a new problem. The current episode started in the past 7 days (about 3 days). The problem has been unchanged. The maximum temperature recorded prior to his arrival was 100.4 - 100.9 F. The fever has been present for less than 1 day. Associated symptoms include congestion, coughing, headaches, a plugged ear sensation, rhinorrhea, sinus pain and a sore throat. He has tried NSAIDs for the symptoms. The treatment provided mild relief.    Patient reports that he was at the [facility] tournament for 2 days. He has had no known exposure to flu or coronavirus that he is aware of.  He states his anxiety is better but states that it still is an issue.  Bothers him every day.  Allergies  Allergen Reactions  . Biaxin [Clarithromycin] Other (See Comments)    Happen when was a child; has been able to take Z Pak     Current Outpatient Medications:  .  albuterol (PROVENTIL HFA;VENTOLIN HFA) 108 (90 Base) MCG/ACT inhaler, Inhale 1-2 puffs into the lungs every 6 (six) hours as needed for wheezing or shortness of breath., Disp: 54 g, Rfl: 5 .  ALPRAZolam (XANAX) 0.5 MG tablet, Take 1 tablet (0.5 mg total) by mouth 3 (three) times daily as needed for anxiety., Disp: 90 tablet, Rfl: 1 .  budesonide-formoterol (SYMBICORT) 80-4.5 MCG/ACT inhaler, Inhale 2 puffs into the lungs 2 (two) times daily., Disp: 1 Inhaler, Rfl: 3 .  montelukast (SINGULAIR) 10 MG tablet, TAKE 1 TABLET BY MOUTH  EVERY DAY, Disp: 90 tablet, Rfl: 3 .  Multiple Vitamin (MULTIVITAMIN WITH MINERALS) TABS tablet, Take 1 tablet by mouth daily., Disp: , Rfl:  .  sertraline (ZOLOFT) 50 MG tablet, Take 1 tablet (50 mg total) by mouth daily., Disp: 30 tablet, Rfl: 5 .  amoxicillin-clavulanate (AUGMENTIN) 875-125 MG  tablet, Take 1 tablet by mouth 2 (two) times daily. (Patient not taking: Reported on 08/14/2018), Disp: 20 tablet, Rfl: 0 .  benzonatate (TESSALON) 200 MG capsule, Take 1 capsule (200 mg total) by mouth 3 (three) times daily as needed for cough. (Patient not taking: Reported on 08/14/2018), Disp: 30 capsule, Rfl: 0 .  predniSONE (DELTASONE) 10 MG tablet, Take 6 tabs PO on day 1&2, 5 tabs PO on day 3&4, 4 tabs PO on day 5&6, 3 tabs PO on day 7&8, 2 tabs PO on day 9&10, 1 tab PO on day 11&12. (Patient not taking: Reported on 08/14/2018), Disp: 42 tablet, Rfl: 0  Review of Systems  Constitutional: Negative.   HENT: Positive for congestion, rhinorrhea, sinus pain and sore throat.   Eyes: Negative.   Respiratory: Positive for cough.   Cardiovascular: Negative.   Gastrointestinal: Negative.   Endocrine: Negative.   Allergic/Immunologic: Negative.   Neurological: Positive for headaches.  Psychiatric/Behavioral: Negative.     Social History   Tobacco Use  . Smoking status: Never Smoker  . Smokeless tobacco: Never Used  Substance Use Topics  . Alcohol use: Yes    Alcohol/week: 0.0 standard drinks    Comment: occasionally      Objective:   BP 126/82 (BP Location: Left Arm, Patient Position: Sitting, Cuff Size: Large)   Pulse 84   Temp 98.8 F (  37.1 C)   Resp 16   Ht 5\' 11"  (1.803 m)   Wt (!) 335 lb (152 kg)   SpO2 96%   BMI 46.72 kg/m  Vitals:   10/22/18 0935  BP: 126/82  Pulse: 84  Resp: 16  Temp: 98.8 F (37.1 C)  SpO2: 96%  Weight: (!) 335 lb (152 kg)  Height: 5\' 11"  (1.803 m)     Physical Exam Vitals signs reviewed.  Constitutional:      Appearance: He is well-developed. He is obese. He is not ill-appearing or toxic-appearing.     Comments: Obes WM NAD.  HENT:     Head: Normocephalic and atraumatic.     Right Ear: Tympanic membrane and external ear normal.     Left Ear: Tympanic membrane and external ear normal.     Nose: Nose normal.     Mouth/Throat:     Pharynx:  Oropharynx is clear.  Eyes:     General: No scleral icterus.    Conjunctiva/sclera: Conjunctivae normal.  Neck:     Thyroid: No thyromegaly.  Cardiovascular:     Rate and Rhythm: Normal rate and regular rhythm.     Heart sounds: Normal heart sounds.  Pulmonary:     Effort: Pulmonary effort is normal.     Breath sounds: Normal breath sounds.  Abdominal:     Palpations: Abdomen is soft.  Skin:    General: Skin is warm and dry.  Neurological:     General: No focal deficit present.     Mental Status: He is alert and oriented to person, place, and time. Mental status is at baseline.  Psychiatric:        Mood and Affect: Mood normal.        Behavior: Behavior normal.        Thought Content: Thought content normal.        Judgment: Judgment normal.         Assessment & Plan    1. Anxiety Double sertraline dose. - sertraline (ZOLOFT) 100 MG tablet; Take 1 tablet (100 mg total) by mouth daily.  Dispense: 90 tablet; Refill: 3  2. Viral upper respiratory tract infection Reassured patient.  During exam today I did not hear patient cough at all.  Has no travel exposure or risk of coronavirus at this time.  He worsens he will let us know.  3. Class 3 severe obesity due to excess calories without serious comorbidity with body mass index (BMI) of 40.0 to 44.9 in adult (HCC)   4. Mild persistent asthma without complication Presently under good control despite URI.    I have done the exam and reviewed the above chart and it is accurate to the best of my knowledge. Dentist has been used in this note in any air is in the dictation or transcription are unintentional.  Megan Mans, MD  [facility]

## 2018-12-11 ENCOUNTER — Telehealth: Payer: Self-pay | Admitting: Family Medicine

## 2018-12-11 DIAGNOSIS — J4530 Mild persistent asthma, uncomplicated: Secondary | ICD-10-CM

## 2018-12-11 MED ORDER — MONTELUKAST SODIUM 10 MG PO TABS: 10.0000 mg | ORAL_TABLET | Freq: Every day | ORAL | 3 refills | Status: AC

## 2018-12-11 NOTE — Telephone Encounter (Signed)
Pt needs refill on Montelukast 10 mg  [facility] [address]  CB#  [phone]  Thanks, Barth Kirks

## 2019-01-13 ENCOUNTER — Ambulatory Visit: Payer: Self-pay | Admitting: Family Medicine

## 2019-02-17 ENCOUNTER — Telehealth: Payer: Self-pay | Admitting: Family Medicine

## 2019-02-17 DIAGNOSIS — Z20822 Contact with and (suspected) exposure to covid-19: Secondary | ICD-10-CM

## 2019-02-17 NOTE — Telephone Encounter (Signed)
Pt is scheduled for MyChart visit on 02/20/2019 and has a sore throat. Please advise for Covid testing. Thanks TNP

## 2019-02-17 NOTE — Telephone Encounter (Signed)
Please order covid testing for cough, sore throat, fever. Thanks!

## 2019-02-18 ENCOUNTER — Other Ambulatory Visit: Payer: Managed Care, Other (non HMO)

## 2019-02-18 DIAGNOSIS — Z20822 Contact with and (suspected) exposure to covid-19: Secondary | ICD-10-CM

## 2019-02-18 NOTE — Telephone Encounter (Signed)
Scheduled patient for COVID 19 test today at 11:15 am.  Testing protocol reviewed with patient.

## 2019-02-20 ENCOUNTER — Telehealth (INDEPENDENT_AMBULATORY_CARE_PROVIDER_SITE_OTHER): Payer: Managed Care, Other (non HMO) | Admitting: Family Medicine

## 2019-02-20 DIAGNOSIS — F988 Other specified behavioral and emotional disorders with onset usually occurring in childhood and adolescence: Secondary | ICD-10-CM

## 2019-02-20 DIAGNOSIS — J4541 Moderate persistent asthma with (acute) exacerbation: Secondary | ICD-10-CM

## 2019-02-20 DIAGNOSIS — J309 Allergic rhinitis, unspecified: Secondary | ICD-10-CM

## 2019-02-20 DIAGNOSIS — Z6841 Body Mass Index (BMI) 40.0 and over, adult: Secondary | ICD-10-CM

## 2019-02-20 MED ORDER — DEXAMETHASONE 4 MG PO TABS: 4.0000 mg | ORAL_TABLET | Freq: Three times a day (TID) | ORAL | 1 refills | Status: AC

## 2019-02-20 MED ORDER — DOXYCYCLINE HYCLATE 100 MG PO TABS: 100.0000 mg | ORAL_TABLET | Freq: Two times a day (BID) | ORAL | 0 refills | Status: DC

## 2019-02-20 NOTE — Progress Notes (Signed)
Patient: Cory Warren Male    DOB: 01-Jul-1991   27 y.o.   MRN: [id number] Visit Date: 02/20/2019  Today's Provider: Megan Mansichard Gilbert Jr, MD   No chief complaint on file.  Subjective:    HPI Virtual Visit via Video Note  I connected with Cory Warren on 02/20/19 at  1:20 PM EDT by a video enabled telemedicine application and verified that I am speaking with the correct person using two identifiers.  I discussed the limitations of evaluation and management by telemedicine and the availability of in person appointments. The patient expressed understanding and agreed to proceed.  History of Present Illness: 28 year old calls in today for appointment after 3 days of cough hoarseness wheezing and a heaviness in his chest.  He has had 2 possible COVID exposures in the past week.  He is attempting to wear a mask in the social distance.  He is using his albuterol inhaler as needed but Symbicort was too expensive so has not been using that.  He is on Singulair.  His anxiety is doing well.  No depression.   Observations/Objective: Pleasant obese WM NAD. Breathing easily Appears well. Neat,groomed approriately. Allergies  Allergen Reactions  . Biaxin [Clarithromycin] Other (See Comments)    Happen when was a child; has been able to take Z Pak     Current Outpatient Medications:  .  albuterol (PROVENTIL HFA;VENTOLIN HFA) 108 (90 Base) MCG/ACT inhaler, Inhale 1-2 puffs into the lungs every 6 (six) hours as needed for wheezing or shortness of breath., Disp: 54 g, Rfl: 5 .  ALPRAZolam (XANAX) 0.5 MG tablet, Take 1 tablet (0.5 mg total) by mouth 3 (three) times daily as needed for anxiety., Disp: 90 tablet, Rfl: 1 .  amoxicillin-clavulanate (AUGMENTIN) 875-125 MG tablet, Take 1 tablet by mouth 2 (two) times daily. (Patient not taking: Reported on 08/14/2018), Disp: 20 tablet, Rfl: 0 .  benzonatate (TESSALON) 200 MG capsule, Take 1 capsule (200 mg total) by mouth 3 (three) times  daily as needed for cough. (Patient not taking: Reported on 08/14/2018), Disp: 30 capsule, Rfl: 0 .  budesonide-formoterol (SYMBICORT) 80-4.5 MCG/ACT inhaler, Inhale 2 puffs into the lungs 2 (two) times daily., Disp: 1 Inhaler, Rfl: 3 .  montelukast (SINGULAIR) 10 MG tablet, Take 1 tablet (10 mg total) by mouth daily., Disp: 90 tablet, Rfl: 3 .  Multiple Vitamin (MULTIVITAMIN WITH MINERALS) TABS tablet, Take 1 tablet by mouth daily., Disp: , Rfl:  .  predniSONE (DELTASONE) 10 MG tablet, Take 6 tabs PO on day 1&2, 5 tabs PO on day 3&4, 4 tabs PO on day 5&6, 3 tabs PO on day 7&8, 2 tabs PO on day 9&10, 1 tab PO on day 11&12. (Patient not taking: Reported on 08/14/2018), Disp: 42 tablet, Rfl: 0 .  sertraline (ZOLOFT) 100 MG tablet, Take 1 tablet (100 mg total) by mouth daily., Disp: 90 tablet, Rfl: 3  Review of Systems No fevers  Some wheezing. Social History   Tobacco Use  . Smoking status: Never Smoker  . Smokeless tobacco: Never Used  Substance Use Topics  . Alcohol use: Yes    Alcohol/week: 0.0 standard drinks    Comment: occasionally      Objective:   There were no vitals taken for this visit. There were no vitals filed for this visit.   Physical Exam   No results found for any visits on 02/20/19.     Assessment & Plan    Follow Up Instructions:  Asthmatic bronchitis At this time will use Doxy in clarithromycin allergic pt and steroids orally.  Await Civid results done 2 days ago. Instructed to go to ED if he worsens with his breathing.  Obesity 1. Moderate persistent asthmatic bronchitis with acute exacerbation   2. Class 3 severe obesity due to excess calories without serious comorbidity with body mass index (BMI) of 40.0 to 44.9 in adult ([place])   3. Attention deficit disorder, unspecified hyperactivity presence   4. Allergic rhinitis, unspecified seasonality, unspecified trigger   I discussed the assessment and treatment plan with the patient. The patient was  provided an opportunity to ask questions and all were answered. The patient agreed with the plan and demonstrated an understanding of the instructions.   The patient was advised to call back or seek an in-person evaluation if the symptoms worsen or if the condition fails to improve as anticipated.  I provided 9 minutes of non-face-to-face time during this encounter.      Richard Cranford Mon, MD  [facility]

## 2019-02-22 LAB — NOVEL CORONAVIRUS, NAA: SARS-CoV-2, NAA: NOT DETECTED

## 2019-02-24 ENCOUNTER — Telehealth: Payer: Self-pay

## 2019-02-24 ENCOUNTER — Telehealth: Payer: Self-pay | Admitting: Family Medicine

## 2019-02-24 DIAGNOSIS — J4520 Mild intermittent asthma, uncomplicated: Secondary | ICD-10-CM

## 2019-02-24 MED ORDER — BUDESONIDE-FORMOTEROL FUMARATE 80-4.5 MCG/ACT IN AERO: 2.0000 | INHALATION_SPRAY | Freq: Two times a day (BID) | RESPIRATORY_TRACT | 12 refills | Status: DC

## 2019-02-24 NOTE — Telephone Encounter (Signed)
Pt having breathing issues since he has stopped steroids since evist with Rosanna Randy. Wanting to know what the next steps would be since his Covid test came back negative.  Please advise asap.  Thanks, [employer]

## 2019-02-24 NOTE — Telephone Encounter (Signed)
L/M advising below.  

## 2019-02-24 NOTE — Telephone Encounter (Signed)
Patient advised.KW 

## 2019-02-24 NOTE — Telephone Encounter (Signed)
-----   Message from Jerrol Banana., MD sent at 02/23/2019  8:43 AM EDT ----- No covid.

## 2019-02-24 NOTE — Telephone Encounter (Signed)
Please review. Thanks!  

## 2019-02-24 NOTE — Telephone Encounter (Signed)
symbicort sent to local pharmacy--see me next week if still not improving.

## 2019-02-25 ENCOUNTER — Telehealth: Payer: Self-pay | Admitting: Family Medicine

## 2019-02-25 NOTE — Telephone Encounter (Signed)
Pt needing to talk with Dr. Rosanna Randy regarding the budesonide-formoterol [facility]) 80-4.5 MCG/ACT inhaler before buying it.  He wanted to make sure this is something that may help him because the out of pocket cost is very high.  Pt wanting to speak with Dr. Rosanna Randy if possible. Please call pt back.  Thanks, [employer]

## 2019-02-26 NOTE — Telephone Encounter (Signed)
Please review. Thanks!  

## 2019-02-26 NOTE — Telephone Encounter (Signed)
Have Roey try sample of Asthmanex 200--2 puffs twice a day--that will probably be cheaper and he will know if it works for him.Thanks

## 2019-02-26 NOTE — Telephone Encounter (Signed)
Pt called back for 2nd time checking on this reply from Dr. Rosanna Randy.  Please call pt back asap.  Thanks, [employer]

## 2019-02-26 NOTE — Telephone Encounter (Signed)
Patient advised as below.   He will be on Friday to pick up sample.cbe

## 2019-03-25 ENCOUNTER — Telehealth: Payer: Self-pay | Admitting: Family Medicine

## 2019-03-25 NOTE — Telephone Encounter (Signed)
Pt asking Dr. Rosanna Randy to write the prescription for the Asthmanex Mometasone Nehemiah Massed (free sample) to be written as a prescription.    Please call into:  [facility] DR[facility]ORE [place], [place] AT [facility] OF [place] [phone] (Phone) [phone] (Fax)   Thanks, [facility]

## 2019-03-25 NOTE — Telephone Encounter (Signed)
Please review. I can not find medication on med list. Thanks!

## 2019-03-25 NOTE — Telephone Encounter (Signed)
I left him a sample up front to make sure it helped--generic is ok--thanks

## 2019-03-26 MED ORDER — MOMETASONE FUROATE 220 MCG/INH IN AEPB: 2.0000 | INHALATION_SPRAY | Freq: Every day | RESPIRATORY_TRACT | 12 refills | Status: DC

## 2019-03-26 NOTE — Telephone Encounter (Signed)
Medication was sent into the pharmacy.  

## 2019-04-08 ENCOUNTER — Other Ambulatory Visit: Payer: Self-pay | Admitting: Family Medicine

## 2019-04-08 DIAGNOSIS — J4520 Mild intermittent asthma, uncomplicated: Secondary | ICD-10-CM

## 2019-04-08 MED ORDER — ALBUTEROL SULFATE HFA 108 (90 BASE) MCG/ACT IN AERS: 1.0000 | INHALATION_SPRAY | Freq: Four times a day (QID) | RESPIRATORY_TRACT | 2 refills | Status: DC | PRN

## 2019-04-08 NOTE — Telephone Encounter (Signed)
Pt is needing Vental inhaler not expensive out of pocket.  Needing this asap due to breathing issues.  Please let pt know if this can be done and call into: [facility] DRUG STORE [place], [place] AT [place] [phone] (Phone) [phone] (Fax)    Thanks, [facility]

## 2019-04-18 ENCOUNTER — Telehealth: Payer: Self-pay

## 2019-04-22 NOTE — Telephone Encounter (Signed)
Encounter error

## 2019-05-08 ENCOUNTER — Other Ambulatory Visit: Payer: Self-pay

## 2019-05-08 ENCOUNTER — Encounter (HOSPITAL_COMMUNITY): Payer: Self-pay

## 2019-05-08 ENCOUNTER — Ambulatory Visit (HOSPITAL_COMMUNITY)
Admission: EM | Admit: 2019-05-08 | Discharge: 2019-05-08 | Disposition: A | Discharge status: Home / Self Care | Payer: Managed Care, Other (non HMO) | Attending: Family Medicine | Admitting: Family Medicine

## 2019-05-08 DIAGNOSIS — R030 Elevated blood-pressure reading, without diagnosis of hypertension: Secondary | ICD-10-CM

## 2019-05-08 DIAGNOSIS — H6690 Otitis media, unspecified, unspecified ear: Secondary | ICD-10-CM | POA: Diagnosis not present

## 2019-05-08 MED ORDER — AMOXICILLIN 875 MG PO TABS: 875.0000 mg | ORAL_TABLET | Freq: Two times a day (BID) | ORAL | 0 refills | Status: AC

## 2019-05-08 NOTE — ED Triage Notes (Signed)
Pt cc left ear discomfort x 2 days. 

## 2019-05-08 NOTE — ED Provider Notes (Signed)
[place]   [id number] 05/08/19 Arrival Time: 2585  ASSESSMENT & PLAN:  1. Acute otitis media, unspecified otitis media type   2. Elevated blood pressure reading without diagnosis of hypertension     To begin: Meds ordered this encounter  Medications  . amoxicillin (AMOXIL) 875 MG tablet    Sig: Take 1 tablet (875 mg total) by mouth 2 (two) times daily for 10 days.    Dispense:  20 tablet    Refill:  0   OTC symptom care as needed.  Follow-up Information    Jerrol Banana., MD.   Specialty: Family Medicine Why: As needed. And to recheck your blood pressure. Contact information: [address] [place] [address] [phone]        [place].   Specialty: Urgent Care Why: If worsening or failing to improve as anticipated. Contact information: [place] [employer] [phone]         See AVS for D/C information given. Work note provided.  Reviewed expectations re: course of current medical issues. Questions answered. Outlined signs and symptoms indicating need for more acute intervention. Patient verbalized understanding. After Visit Summary given.   SUBJECTIVE: History from: patient.  Cory Warren is a 28 y.o. male who presents with complaint of left otalgia; without drainage; without bleeding. Onset gradual, approx 2 days ago. Recent cold symptoms: none. Fever: absent. Overall normal PO intake without n/v. Sick contacts: none known. No specific hearing changes. OTC treatment: none. "Feels like ear infections I used to have as a child." No ear trauma or barotrauma.  Social History   Tobacco Use  Smoking Status Never Smoker  Smokeless Tobacco Never Used   Increased BP noted today. Reports that he has not been treated for hypertension in the past. Has been told of increased BP in the past.  He reports no chest pain on exertion, no dyspnea on  exertion, no swelling of ankles, no orthostatic dizziness or lightheadedness, no orthopnea or paroxysmal nocturnal dyspnea, no palpitations and no intermittent claudication symptoms.  ROS: As per HPI. All other systems negative.    OBJECTIVE:  Vitals:   05/08/19 1019 05/08/19 1020  BP:  (!) 150/65  Resp:  18  TempSrc:  Oral  SpO2:  98%  Weight: 131.5 kg     General appearance: alert; NAD Ear Canal: normal bilaterally TM: left: erythematous, dull, bulging inferiorly Throat: normal Neck: supple without LAD; without JVD CV: regular Lungs: unlabored respirations, symmetrical air entry; cough: absent; no respiratory distress Skin: warm and dry Ext: no extremity edema Neuro: normal gait Psychological: alert and cooperative; normal mood and affect  Allergies  Allergen Reactions  . Biaxin [Clarithromycin] Other (See Comments)    Happen when was a child; has been able to take Z Pak    Past Medical History:  Diagnosis Date  . Asthma   . Hypertension    Family History  Problem Relation Age of Onset  . Hyperlipidemia Mother   . Hypertension Father   . Asthma Sister    Social History   Socioeconomic History  . Marital status: Single    Spouse name: Not on file  . Number of children: Not on file  . Years of education: Not on file  . Highest education level: Not on file  Occupational History  . Not on file  Social Needs  . Financial resource strain: Not on file  . Food insecurity  Worry: Not on file    Inability: Not on file  . Transportation needs    Medical: Not on file    Non-medical: Not on file  Tobacco Use  . Smoking status: Never Smoker  . Smokeless tobacco: Never Used  Substance and Sexual Activity  . Alcohol use: Yes    Alcohol/week: 0.0 standard drinks    Comment: occasionally  . Drug use: Yes    Types: Marijuana    Comment: last used 1 1/2 weeks ago.  Marland Kitchen Sexual activity: Not on file  Lifestyle  . Physical activity    Days per week: Not on file     Minutes per session: Not on file  . Stress: Not on file  Relationships  . Social Musician on phone: Not on file    Gets together: Not on file    Attends religious service: Not on file    Active member of club or organization: Not on file    Attends meetings of clubs or organizations: Not on file    Relationship status: Not on file  . Intimate partner violence    Fear of current or ex partner: Not on file    Emotionally abused: Not on file    Physically abused: Not on file    Forced sexual activity: Not on file  Other Topics Concern  . Not on file  Social History Narrative  . Not on file            Mardella Layman, MD 05/08/19 1114

## 2019-06-28 ENCOUNTER — Other Ambulatory Visit: Payer: Self-pay

## 2019-06-28 DIAGNOSIS — Z20822 Contact with and (suspected) exposure to covid-19: Secondary | ICD-10-CM

## 2019-06-30 LAB — NOVEL CORONAVIRUS, NAA: SARS-CoV-2, NAA: NOT DETECTED

## 2019-07-25 ENCOUNTER — Ambulatory Visit (INDEPENDENT_AMBULATORY_CARE_PROVIDER_SITE_OTHER)
Admission: RE | Admit: 2019-07-25 | Discharge: 2019-07-25 | Disposition: A | Discharge status: Home / Self Care | Payer: Managed Care, Other (non HMO) | Source: Ambulatory Visit

## 2019-07-25 DIAGNOSIS — M79672 Pain in left foot: Secondary | ICD-10-CM

## 2019-07-25 DIAGNOSIS — M722 Plantar fascial fibromatosis: Secondary | ICD-10-CM

## 2019-07-25 MED ORDER — PREDNISONE 10 MG (21) PO TBPK: ORAL_TABLET | ORAL | 0 refills | Status: DC

## 2019-07-25 NOTE — Discharge Instructions (Signed)
I believe this is plantar fasciitis We will try a round of steroids to see if this helps with the pain, inflammation. You may want to get inserts for your shoes for more support.   Rest, ice, elevate Follow-up with podiatry for any continued or worsening problems

## 2019-07-25 NOTE — ED Provider Notes (Signed)
Virtual Visit via Video Note:  Cory Warren  initiated request for Telemedicine visit with [facility] Urgent Care team. I connected with [name]  on 07/25/2019 at 2:42 PM  for a synchronized telemedicine visit using a video enabled HIPPA compliant telemedicine application. I verified that I am speaking with [name]  using two identifiers. Orvan July, NP  was physically located in a [facility] Urgent care site and SALMAN WELLEN was located at a different location.   The limitations of evaluation and management by telemedicine as well as the availability of in-person appointments were discussed. Patient was informed that he  may incur a bill ( including co-pay) for this virtual visit encounter. [name]  expressed understanding and gave verbal consent to proceed with virtual visit.     History of Present Illness:Cory Warren  is a 28 y.o. male presents with left heel pain.  This is been constant, worsening over the past few weeks.  He has had increased walking and standing at work.  Wears tennis shoes at work.  Denies any injury to the foot.  Denies any redness, swelling or fevers.  Past Medical History:  Diagnosis Date  . Asthma   . Hypertension     Allergies  Allergen Reactions  . Biaxin [Clarithromycin] Other (See Comments)    Happen when was a child; has been able to take Z Pak        Observations/Objective: VITALS: Per patient if applicable, see vitals. GENERAL: Alert, appears well and in no acute distress. HEENT: Atraumatic, conjunctiva clear, no obvious abnormalities on inspection of external nose and ears. NECK: Normal movements of the head and neck. CARDIOPULMONARY: No increased WOB. Speaking in clear sentences. I:E ratio WNL.  MS: Moves all visible extremities without noticeable abnormality. PSYCH: Pleasant and cooperative, well-groomed. Speech normal rate and rhythm. Affect is appropriate. Insight and judgement are  appropriate. Attention is focused, linear, and appropriate.  NEURO: CN grossly intact. Oriented as arrived to appointment on time with no prompting. Moves both UE equally.  SKIN: No obvious lesions, wounds, erythema, or cyanosis noted on face or hands.    Assessment and Plan: Most likely plantar fasciitis based on symptoms.  We will try prednisone taper to see if this helps.  We will have him rest, ice and elevate the foot.  Recommended inserts for shoes Work note given   Follow Up Instructions:Follow up as needed for continued or worsening symptoms     I discussed the assessment and treatment plan with the patient. The patient was provided an opportunity to ask questions and all were answered. The patient agreed with the plan and demonstrated an understanding of the instructions.   The patient was advised to call back or seek an in-person evaluation if the symptoms worsen or if the condition fails to improve as anticipated.   Orvan July, NP  07/25/2019 2:42 PM         Orvan July, NP 07/26/19 [phone]

## 2019-08-27 ENCOUNTER — Other Ambulatory Visit: Payer: Self-pay | Admitting: Family Medicine

## 2019-08-27 DIAGNOSIS — J4520 Mild intermittent asthma, uncomplicated: Secondary | ICD-10-CM

## 2019-08-27 NOTE — Telephone Encounter (Signed)
Requested/ medication (s) are due for refill today:yes  Requested medication (s) are on the active medication list: yes  Last refill:  04/08/2019  Future visit scheduled: no  Notes to clinic:  pulmonology Beta agonists failed  Requested Prescriptions  Pending Prescriptions Disp Refills   albuterol (VENTOLIN HFA) 108 (90 Base) MCG/ACT inhaler [Pharmacy Med Name: ALBUTEROL HFA INH(200 PUFFS)18GM] 54 g 2    Sig: INHALE 1 TO 2 PUFFS INTO THE LUNGS EVERY 6 HOURS AS NEEDED FOR WHEEZING OR SHORTNESS OF BREATH      Pulmonology:  Beta Agonists Failed - 08/27/2019  5:55 PM      Failed - One inhaler should last at least one month. If the patient is requesting refills earlier, contact the patient to check for uncontrolled symptoms.      Passed - Valid encounter within last 12 months    Recent Outpatient Visits           6 months ago Moderate persistent asthmatic bronchitis with acute exacerbation   [facility] Maple Hudson., MD   10 months ago Viral upper respiratory tract infection   [facility] Maple Hudson., MD   1 year ago Class 3 severe obesity due to excess calories without serious comorbidity with body mass index (BMI) of 40.0 to 44.9 in adult [facility])   [facility] Maple Hudson., MD   1 year ago Moderate persistent asthmatic bronchitis with acute exacerbation   [facility] [place], Alessandra Bevels, PA-C   1 year ago URI, acute   [facility] [place], [place], [place]

## 2019-09-26 IMAGING — CR DG CHEST 2V
2 series · 2 of 2 positions shown · non-contrast
Comparison: None.

CLINICAL DATA: Generalized chest tightness and pressure with
lightheadedness starting about our ago. Smoker.

EXAM:
CHEST - 2 VIEW

[w chest pa]
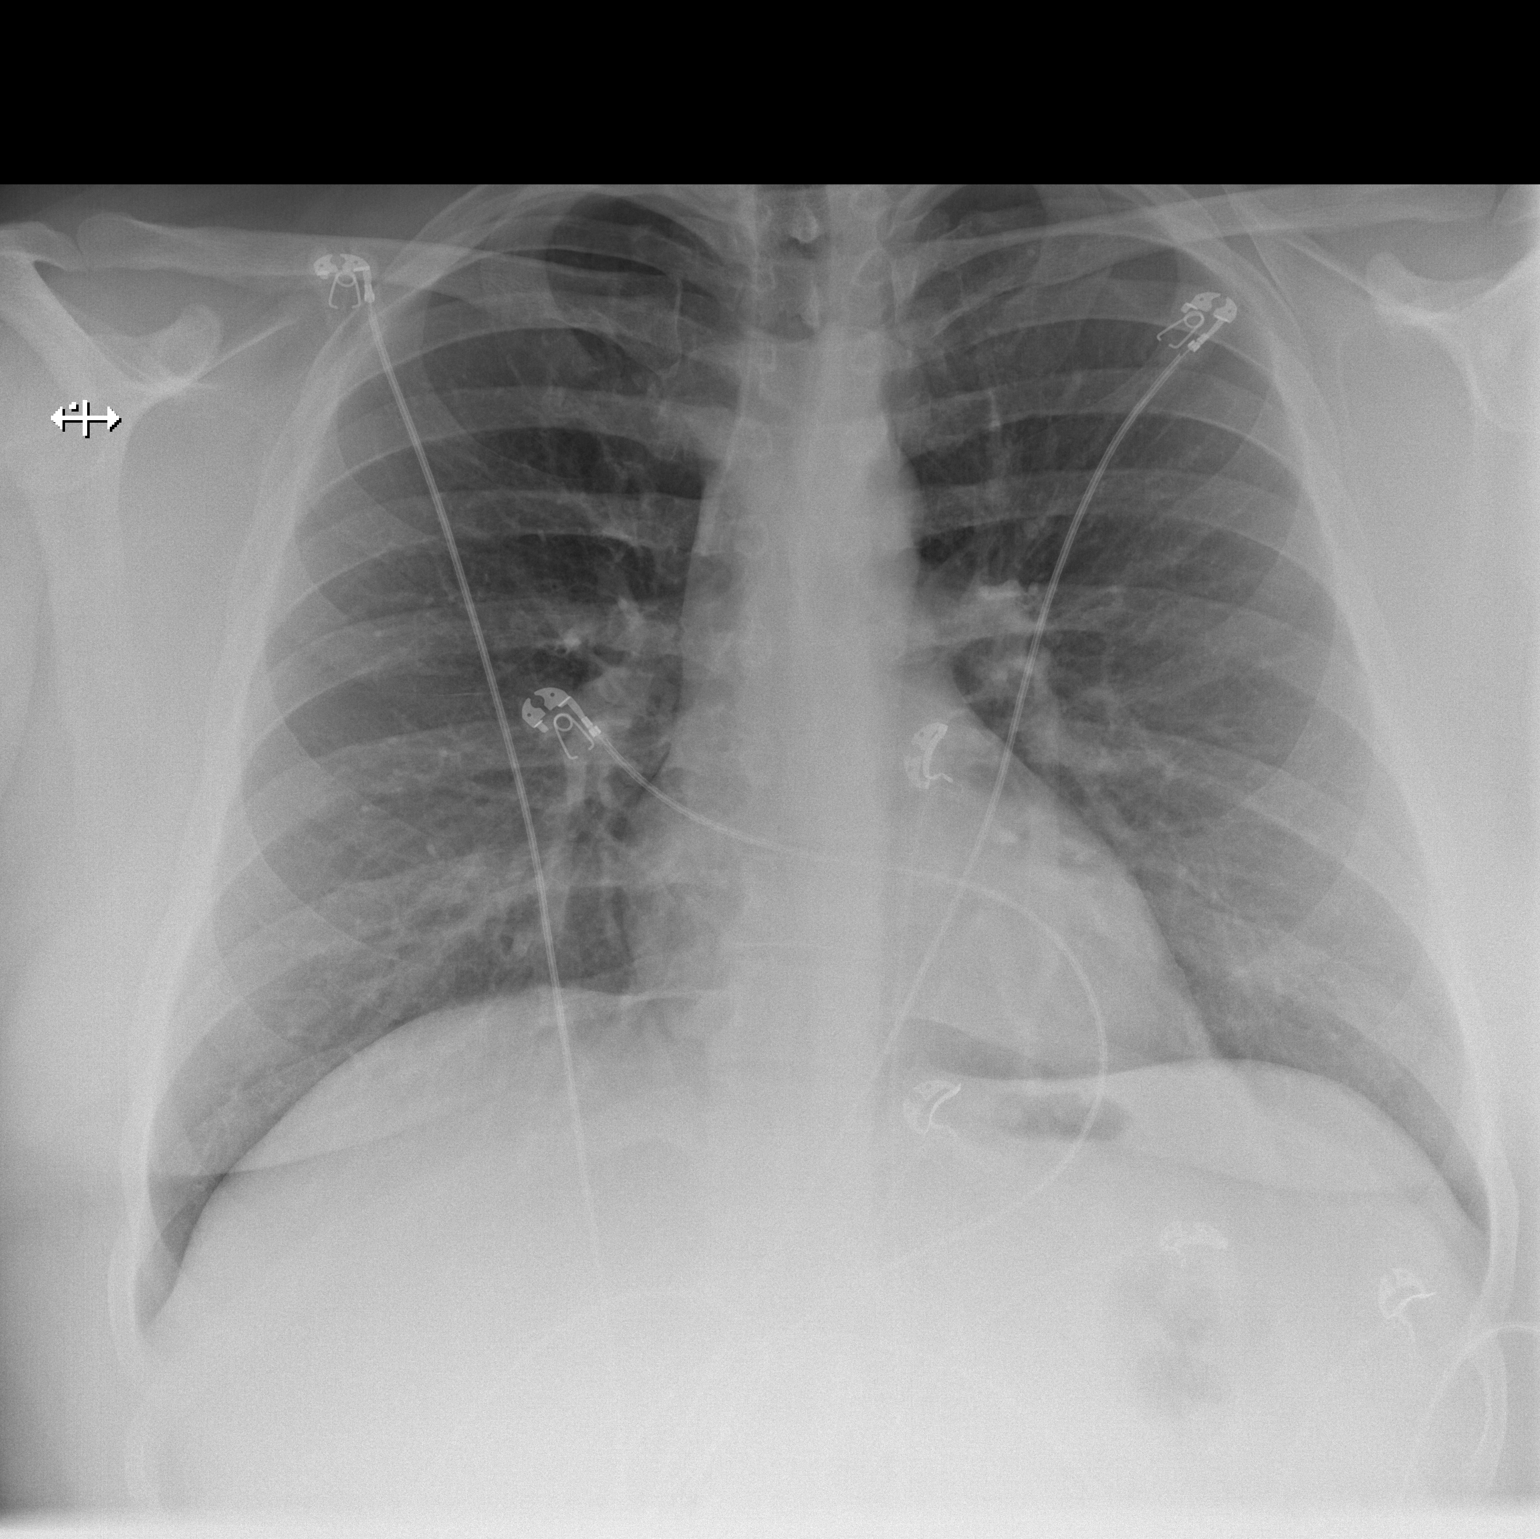

[w chest lat]
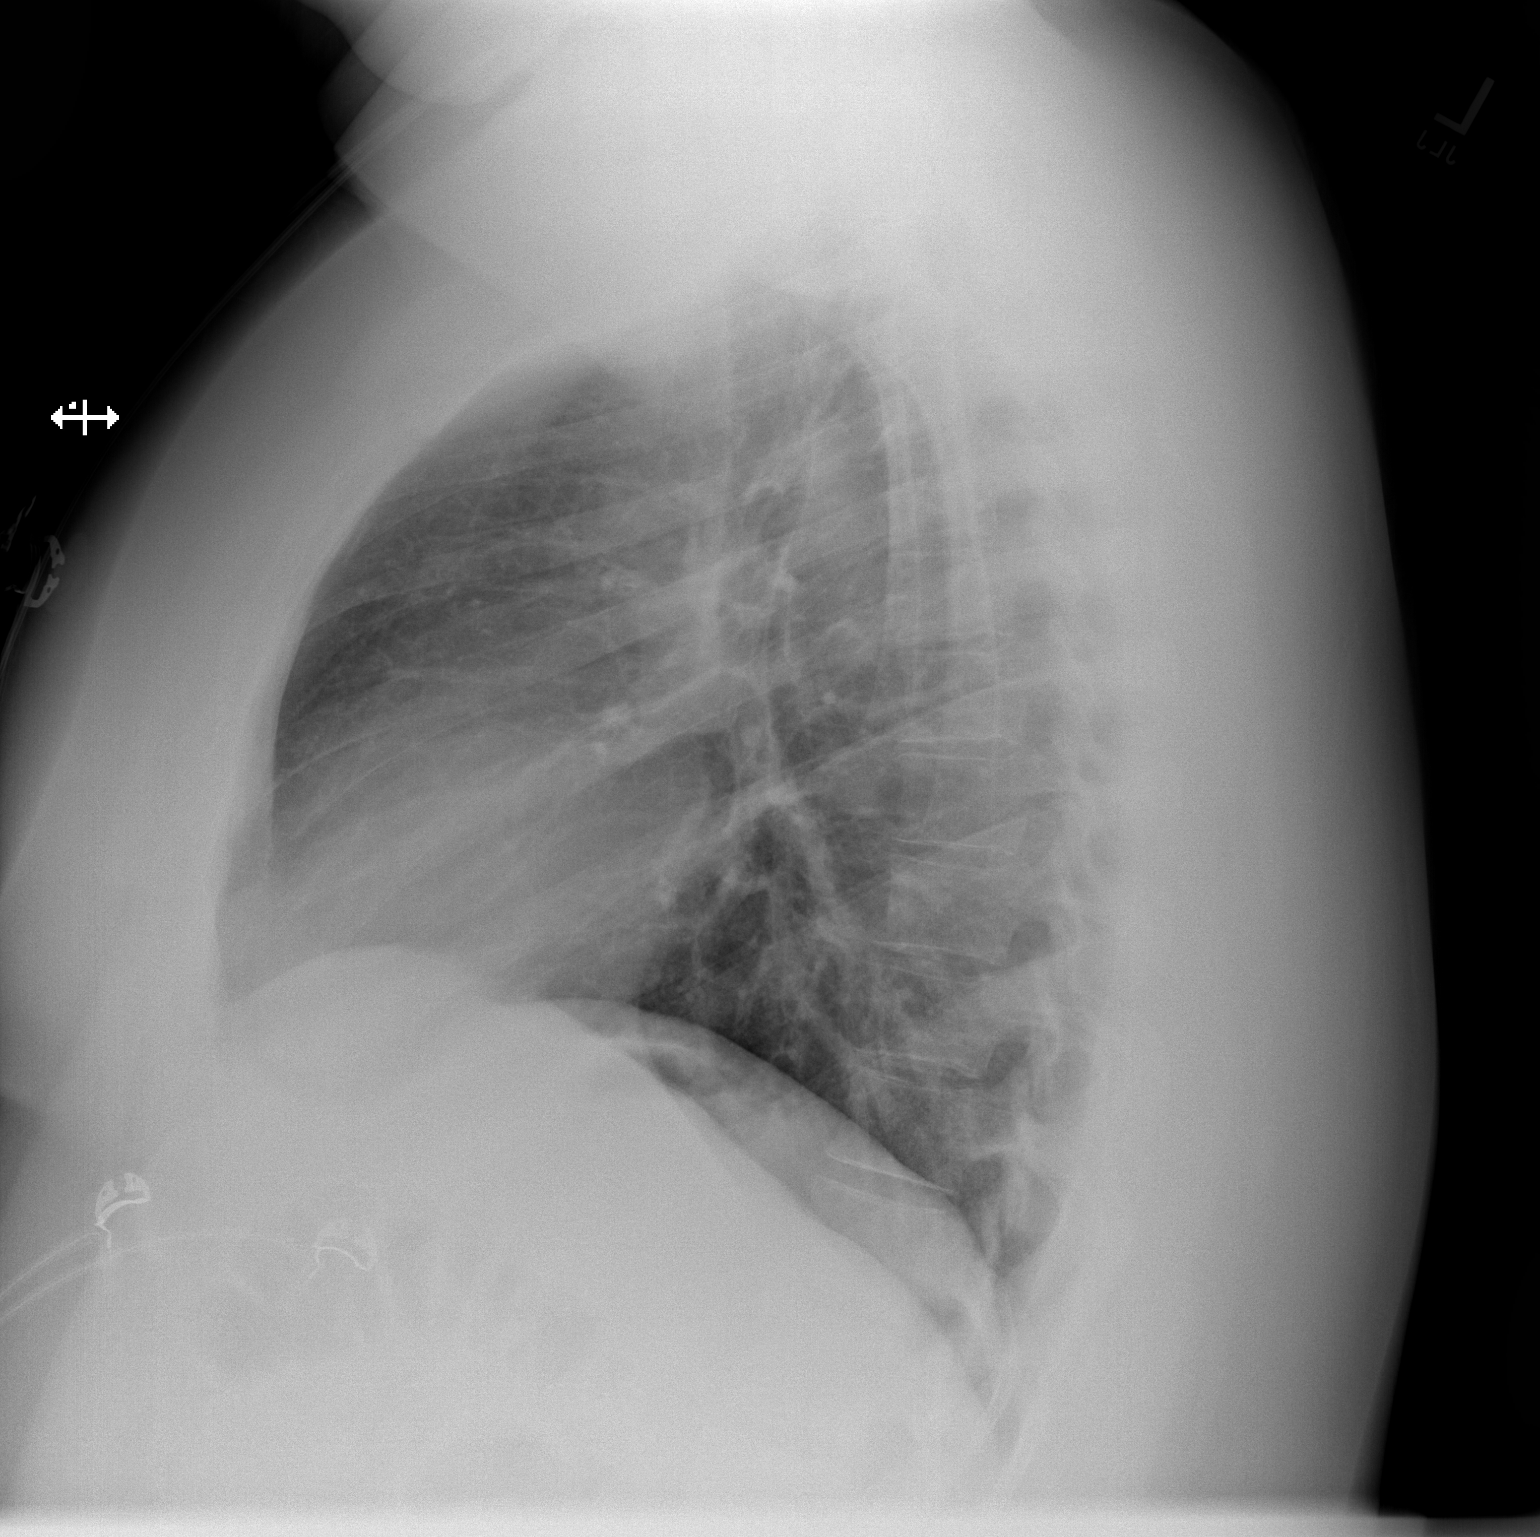

[2 of 2 positions shown; findings below may reference images not displayed]

FINDINGS: Normal heart size and pulmonary vascularity. No focal airspace
disease or consolidation in the lungs. No blunting of costophrenic
angles. No pneumothorax. Mediastinal contours appear intact.
IMPRESSION: No active cardiopulmonary disease.

## 2019-11-07 ENCOUNTER — Ambulatory Visit: Payer: 59 | Attending: Internal Medicine

## 2019-11-07 DIAGNOSIS — Z23 Encounter for immunization: Secondary | ICD-10-CM

## 2019-11-07 NOTE — Progress Notes (Signed)
   Covid-19 Vaccination Clinic  Name:  Cory Warren    MRN: [id number] DOB: 09-14-1990  11/07/2019  Mr. Chiarelli was observed post Covid-19 immunization for 30 minutes based on pre-vaccination screening without incident. He was provided with Vaccine Information Sheet and instruction to access the V-Safe system.   Mr. Hollomon was instructed to call 911 with any severe reactions post vaccine: Marland Kitchen Difficulty breathing  . Swelling of face and throat  . A fast heartbeat  . A bad rash all over body  . Dizziness and weakness   Immunizations Administered    Name Date Dose VIS Date Route   Pfizer COVID-19 Vaccine 11/07/2019 10:59 AM 0.3 mL 07/18/2019 Intramuscular   Manufacturer: [employer], [employer]   Lot: [id number]   NDC: [address]-1

## 2019-12-03 ENCOUNTER — Ambulatory Visit: Payer: 59 | Attending: Internal Medicine

## 2019-12-03 DIAGNOSIS — Z23 Encounter for immunization: Secondary | ICD-10-CM

## 2019-12-03 NOTE — Progress Notes (Signed)
   Covid-19 Vaccination Clinic  Name:  Cory Warren    MRN: [id number] DOB: [birth year]  12/03/2019  [name] was observed post Covid-19 immunization for 15 minutes without incident. He was provided with Vaccine Information Sheet and instruction to access the V-Safe system.   [name] was instructed to call 911 with any severe reactions post vaccine: Marland Kitchen Difficulty breathing  . Swelling of face and throat  . A fast heartbeat  . A bad rash all over body  . Dizziness and weakness   Immunizations Administered    Name Date Dose VIS Date Route   Pfizer COVID-19 Vaccine 12/03/2019  8:49 AM 0.3 mL 10/01/2018 Intramuscular   Manufacturer: [employer], [employer]   Lot: [id number]   NDC: [address]-5

## 2019-12-24 ENCOUNTER — Other Ambulatory Visit: Payer: Self-pay | Admitting: Family Medicine

## 2019-12-24 DIAGNOSIS — J4520 Mild intermittent asthma, uncomplicated: Secondary | ICD-10-CM

## 2019-12-24 NOTE — Telephone Encounter (Signed)
Attempted to call patient to check on asthma status- mailbox full- unable to leave message. Patient meets protocol for visits- was seen July/20 for asthma flare. Refilled with no additional

## 2019-12-24 NOTE — Telephone Encounter (Signed)
Patient called in about the refill on his inhaler states that he is completely out and if Dr Sullivan Lone cann please send on in to the pharmacy today he is willing to come in for a visit tomorrow if need be but due to the high allergy rate he is badly in need of this inhaler today please.

## 2020-01-22 ENCOUNTER — Ambulatory Visit: Payer: 59 | Admitting: Orthopaedic Surgery

## 2020-01-22 ENCOUNTER — Ambulatory Visit: Payer: Self-pay

## 2020-01-22 VITALS — Ht 71.0 in | Wt 300.0 lb

## 2020-01-22 DIAGNOSIS — M25562 Pain in left knee: Secondary | ICD-10-CM | POA: Diagnosis not present

## 2020-01-22 NOTE — Progress Notes (Signed)
Office Visit Note   Patient: Cory Warren           Date of Birth: 05-06-1991           MRN: [id number] Visit Date: 01/22/2020              Requested by: Cory Warren., MD [address] [place],  [place] [address] PCP: Cory Warren., MD   Assessment & Plan: Visit Diagnoses:  1. Acute pain of left knee     Plan: Impression is left knee injury with effusion.  My concern is that he has internal derangement as a result of his injury and his fusion so I recommended an MRI but the patient would like to give it a month to see if it will get better due to concerns of the cost of an MRI.  He has crutches at home that he will use.  He will follow-up with [place] in about 4 weeks.  Follow-Up Instructions: Return in about 4 weeks (around 02/19/2020).   Orders:  Orders Placed This Encounter  Procedures  . XR Knee Complete 4 Views Left   No orders of the defined types were placed in this encounter.     Procedures: No procedures performed   Clinical Data: No additional findings.   Subjective: Chief Complaint  Patient presents with  . Left Knee - Pain    Cory Warren is a 29 year old gentleman comes in for evaluation of left knee pain for about 2 weeks.  He had an injury about 2 weeks ago when he jumped off a dock at [facility] and unfortunately the water was a lot shallower than he anticipated and his leg struck the bottom with the knee fully extended.  He felt a crunching pain and since then he has had swelling and painful weightbearing.  He feels like the knee wants to give way.  He has tried wearing a brace at work.  He works at [employer].   Review of Systems  Constitutional: Negative.   All other systems reviewed and are negative.    Objective: Vital Signs: Ht 5\' 11"  (1.803 m)   Wt 300 lb (136.1 kg)   BMI 41.84 kg/m   Physical Exam Vitals and nursing note reviewed.  Constitutional:      Appearance: He is well-developed.  HENT:      Head: Normocephalic and atraumatic.  Eyes:     Pupils: Pupils are equal, round, and reactive to light.  Pulmonary:     Effort: Pulmonary effort is normal.  Abdominal:     Palpations: Abdomen is soft.  Musculoskeletal:        General: Normal range of motion.     Cervical back: Neck supple.  Skin:    General: Skin is warm.  Neurological:     Mental Status: He is alert and oriented to person, place, and time.  Psychiatric:        Behavior: Behavior normal.        Thought Content: Thought content normal.        Judgment: Judgment normal.     Ortho Exam Left knee shows joint effusion.  Painful weightbearing and ambulation with a small limp.  He has pain with knee flexion.  Collaterals are stable.  Cruciate exam is limited secondary to guarding.  No significant joint line tenderness. Specialty Comments:  No specialty comments available.  Imaging: XR Knee Complete 4 Views Left  Result Date: 01/22/2020 Mildly advanced degenerative changes  of left knee    PMFS History: Patient Active Problem List   Diagnosis Date Noted  . Chest pain 03/28/2018  . Overweight 03/28/2018  . Derangement of knee 08/03/2017  . Radial styloid tenosynovitis 08/03/2017  . ADD (attention deficit disorder) 07/21/2015  . Problems influencing health status 07/21/2015  . Airway hyperreactivity 07/21/2015  . Adiposity 07/21/2015  . Migraine 07/21/2015   Past Medical History:  Diagnosis Date  . Asthma   . Hypertension     Family History  Problem Relation Age of Onset  . Hyperlipidemia Mother   . Hypertension Father   . Asthma Sister     Past Surgical History:  Procedure Laterality Date  . KNEE SURGERY     Social History   Occupational History  . Not on file  Tobacco Use  . Smoking status: Never Smoker  . Smokeless tobacco: Never Used  Vaping Use  . Vaping Use: Former  Substance and Sexual Activity  . Alcohol use: Yes    Alcohol/week: 0.0 standard drinks    Comment: occasionally  .  Drug use: Yes    Types: Marijuana    Comment: last used 1 1/2 weeks ago.  Marland Kitchen Sexual activity: Not on file

## 2020-02-18 ENCOUNTER — Other Ambulatory Visit: Payer: Self-pay | Admitting: Family Medicine

## 2020-02-18 ENCOUNTER — Telehealth (INDEPENDENT_AMBULATORY_CARE_PROVIDER_SITE_OTHER): Payer: 59 | Admitting: Family Medicine

## 2020-02-18 DIAGNOSIS — Z6841 Body Mass Index (BMI) 40.0 and over, adult: Secondary | ICD-10-CM | POA: Diagnosis not present

## 2020-02-18 DIAGNOSIS — J4520 Mild intermittent asthma, uncomplicated: Secondary | ICD-10-CM

## 2020-02-18 MED ORDER — ALBUTEROL SULFATE HFA 108 (90 BASE) MCG/ACT IN AERS: INHALATION_SPRAY | RESPIRATORY_TRACT | 0 refills | Status: DC

## 2020-02-18 MED ORDER — BUDESONIDE-FORMOTEROL FUMARATE 80-4.5 MCG/ACT IN AERO: 2.0000 | INHALATION_SPRAY | Freq: Two times a day (BID) | RESPIRATORY_TRACT | 12 refills | Status: DC

## 2020-02-18 NOTE — Telephone Encounter (Signed)
albuterol (VENTOLIN HFA) 108 (90 Base) MCG/ACT inhaler    Patient is requesting a refill.     Pharmacy:  [id number] - [place], [place] - [address] AT [place]  [facility] [address] Phone:  [phone]  Fax:  [phone]

## 2020-02-18 NOTE — Progress Notes (Addendum)
Inetta Fermo Cummings,acting as a scribe for Megan Mans, MD.,have documented all relevant documentation on the behalf of Megan Mans, MD,as directed by  Megan Mans, MD while in the presence of Megan Mans, MD. MyChart Video Visit  Virtual Visit via Video Note  I connected with Cory Warren on 02/20/20 at  8:20 AM EDT by a video enabled telemedicine application and verified that I am speaking with the correct person using two identifiers.  Location: Patient: Home Provider: Office.   I discussed the limitations of evaluation and management by telemedicine and the availability of in person appointments. The patient expressed understanding and agreed to proceed.  History of Present Illness:    Observations/Objective:   Assessment and Plan:   Follow Up Instructions:    I discussed the assessment and treatment plan with the patient. The patient was provided an opportunity to ask questions and all were answered. The patient agreed with the plan and demonstrated an understanding of the instructions.   The patient was advised to call back or seek an in-person evaluation if the symptoms worsen or if the condition fails to improve as anticipated.  I provided 10 minutes of non-face-to-face time during this encounter.   Laurey Salser Wendelyn Breslow, MD   Virtual Visit via Video Note   This visit type was conducted due to national recommendations for restrictions regarding the COVID-19 Pandemic (e.g. social distancing) in an effort to limit this patient's exposure and mitigate transmission in our community. This patient is at least at moderate risk for complications without adequate follow up. This format is felt to be most appropriate for this patient at this time. Physical exam was limited by quality of the video and audio technology used for the visit.   Patient location: Home Provider location: Office   Patient: Cory Warren   DOB: 1991-01-31   28  y.o. Male  MRN: [id number] Visit Date: 02/18/2020  Today's healthcare provider: Megan Mans, MD   Chief Complaint  Patient presents with  . Medication Refill   Subjective    HPI   Patient presents today for a virtual visit to have albuterol inhaler refilled.  His asthma is little worse recently and is using his rescue inhaler a lot.  He was on a Symbicort before and this helped a lot.  It was just very expensive.  He would like to have this done again.  Overall he is doing well.  He is now living in [place] and embarking on a career with [employer] Overall patient feels well and has no other complaints.  Medications: Outpatient Medications Prior to Visit  Medication Sig  . albuterol (VENTOLIN HFA) 108 (90 Base) MCG/ACT inhaler INHALE 1 TO 2 PUFFS INTO THE LUNGS EVERY 6 HOURS AS NEEDED FOR WHEEZING OR SHORTNESS OF BREATH  . ALPRAZolam (XANAX) 0.5 MG tablet Take 1 tablet (0.5 mg total) by mouth 3 (three) times daily as needed for anxiety.  . budesonide-formoterol (SYMBICORT) 80-4.5 MCG/ACT inhaler Inhale 2 puffs into the lungs 2 (two) times daily.  . budesonide-formoterol (SYMBICORT) 80-4.5 MCG/ACT inhaler Inhale 2 puffs into the lungs 2 (two) times daily.  . mometasone (ASMANEX) 220 MCG/INH inhaler Inhale 2 puffs into the lungs daily.  . montelukast (SINGULAIR) 10 MG tablet Take 1 tablet (10 mg total) by mouth daily.  . Multiple Vitamin (MULTIVITAMIN WITH MINERALS) TABS tablet Take 1 tablet by mouth daily.  . predniSONE (STERAPRED UNI-PAK 21 TAB) 10 MG (21) TBPK tablet 6  tabs for 1 day, then 5 tabs for 1 das, then 4 tabs for 1 day, then 3 tabs for 1 day, 2 tabs for 1 day, then 1 tab for 1 day  . sertraline (ZOLOFT) 100 MG tablet Take 1 tablet (100 mg total) by mouth daily.   No facility-administered medications prior to visit.    Review of Systems     Objective    There were no vitals taken for this visit. Wt Readings from Last 3 Encounters:  01/22/20 300 lb (136.1 kg)    05/08/19 290 lb (131.5 kg)  10/22/18 (!) 335 lb (152 kg)      Physical Exam     Assessment & Plan     1. Intermittent asthma without complication, unspecified asthma severity Restart Symbicort.  Follow-up in the office if it does not improve. - budesonide-formoterol (SYMBICORT) 80-4.5 MCG/ACT inhaler; Inhale 2 puffs into the lungs 2 (two) times daily.  Dispense: 1 Inhaler; Refill: 12  2. Class 3 severe obesity due to excess calories without serious comorbidity with body mass index (BMI) of 40.0 to 44.9 in adult [facility]) Patient trying to work on lifestyle.   No follow-ups on file.     I discussed the assessment and treatment plan with the patient. The patient was provided an opportunity to ask questions and all were answered. The patient agreed with the plan and demonstrated an understanding of the instructions.   The patient was advised to call back or seek an in-person evaluation if the symptoms worsen or if the condition fails to improve as anticipated.  I provided 10 minutes of non-face-to-face time during this encounter.    Charnel Giles Wendelyn Breslow, MD [facility] [phone] (phone) [phone] (fax)  [facility]

## 2020-02-19 ENCOUNTER — Ambulatory Visit: Payer: 59 | Admitting: Orthopaedic Surgery

## 2020-03-11 ENCOUNTER — Telehealth: Payer: Self-pay

## 2020-03-11 NOTE — Telephone Encounter (Signed)
Copied from CRM [id number]. Topic: Appointment Scheduling - Scheduling Inquiry for Clinic >> Mar 11, 2020 10:33 AM Cory Warren D wrote: Reason for CRM: Pt called saying he was having cough and some SOB.  He is fully vaccinated.  He wanted to make an appt but there is not anything available today.  He wants to know if someone would call in an antibiotic,  [facility] [address]  CB#  [phone]

## 2020-03-12 ENCOUNTER — Telehealth (INDEPENDENT_AMBULATORY_CARE_PROVIDER_SITE_OTHER): Payer: 59 | Admitting: Adult Health

## 2020-03-12 ENCOUNTER — Encounter: Payer: Self-pay | Admitting: Adult Health

## 2020-03-12 DIAGNOSIS — J209 Acute bronchitis, unspecified: Secondary | ICD-10-CM | POA: Insufficient documentation

## 2020-03-12 DIAGNOSIS — Z6841 Body Mass Index (BMI) 40.0 and over, adult: Secondary | ICD-10-CM

## 2020-03-12 DIAGNOSIS — R059 Cough, unspecified: Secondary | ICD-10-CM | POA: Insufficient documentation

## 2020-03-12 DIAGNOSIS — R05 Cough: Secondary | ICD-10-CM

## 2020-03-12 DIAGNOSIS — J069 Acute upper respiratory infection, unspecified: Secondary | ICD-10-CM

## 2020-03-12 DIAGNOSIS — J4541 Moderate persistent asthma with (acute) exacerbation: Secondary | ICD-10-CM | POA: Diagnosis not present

## 2020-03-12 DIAGNOSIS — Z20822 Contact with and (suspected) exposure to covid-19: Secondary | ICD-10-CM | POA: Diagnosis not present

## 2020-03-12 DIAGNOSIS — J45909 Unspecified asthma, uncomplicated: Secondary | ICD-10-CM

## 2020-03-12 MED ORDER — PREDNISONE 10 MG PO TABS: ORAL_TABLET | ORAL | 0 refills | Status: AC

## 2020-03-12 MED ORDER — AMOXICILLIN-POT CLAVULANATE 875-125 MG PO TABS: 1.0000 | ORAL_TABLET | Freq: Two times a day (BID) | ORAL | 0 refills | Status: DC

## 2020-03-12 MED ORDER — BENZONATATE 100 MG PO CAPS: 100.0000 mg | ORAL_CAPSULE | Freq: Three times a day (TID) | ORAL | 0 refills | Status: DC | PRN

## 2020-03-12 NOTE — Progress Notes (Signed)
I,April Miller,acting as a scribe for [employer], FNP.,have documented all relevant documentation on the behalf of Jairo Ben, FNP,as directed by  Jairo Ben, FNP while in the presence of Jairo Ben, FNP.   MyChart Video Visit- video not connecting properly had to switch to telephone visit.     Virtual Visit via Video Note   This visit type was conducted due to national recommendations for restrictions regarding the COVID-19 Pandemic (e.g. social distancing) in an effort to limit this patient's exposure and mitigate transmission in our community. This patient is at least at moderate risk for complications without adequate follow up. This format is felt to be most appropriate for this patient at this time. Physical exam was limited by quality of the video and audio technology used for the visit.   Patient location: at home Provider location:  Provider: Provider's office at  [facility] [place].      Patient: Cory Warren   DOB: 02/27/1991   28 y.o. Male  MRN: [id number] Visit Date: 03/12/2020  Today's healthcare provider: Jairo Ben, FNP   No chief complaint on file.  Subjective    HPI   He went to the Urgent Care and was tested on 03/10/2020- he reports he had symptoms for one day not even 24 hours  that started on 03/09/2020 and when he went to urgent care. Symptoms came on suddenly he reports.Treated as viral and sent Covid test on day 2 of symptoms, he has not received results.   Denies any distress.  Shortness of breath is improving however he reports he has croupy/ hoarseness with cough and a wheezing  breathing pattern at times. No stridor. Mild chest tightness.  For the first couple of days he had loss taste of smell and taste on day. He took over the counter medications and that seemed to resolve.   Has history of uncontrolled asthma, bronchitis and obesity. Denies any history of  pneumonia.  He does have productive cough with some yellow color.  No known exposures.  He reports he feels like his lungs are sore from coughing.  Patient has a history of moderate persistent asthma. Is on maintenance inhaler and   He is fully vaccinated since April with Covid vaccine.   Patient  denies any fever, body aches,chills, rash, chest pain,  nausea, vomiting, or diarrhea.  Denies dizziness, lightheadedness, pre syncopal or syncopal episodes.   Patient Active Problem List   Diagnosis Date Noted  . Upper respiratory tract infection 03/12/2020  . Suspected COVID-19 virus infection 03/12/2020  . Bronchitis with asthma, acute 03/12/2020  . Cough 03/12/2020  . Chest pain 03/28/2018  . Overweight 03/28/2018  . Derangement of knee 08/03/2017  . Radial styloid tenosynovitis 08/03/2017  . ADD (attention deficit disorder) 07/21/2015  . Problems influencing health status 07/21/2015  . Asthmatic bronchitis 07/21/2015  . Adiposity 07/21/2015  . Migraine 07/21/2015   Past Medical History:  Diagnosis Date  . Asthma   . Hypertension    Past Surgical History:  Procedure Laterality Date  . KNEE SURGERY     Social History   Tobacco Use  . Smoking status: Never Smoker  . Smokeless tobacco: Never Used  Vaping Use  . Vaping Use: Former  Substance Use Topics  . Alcohol use: Yes    Alcohol/week: 0.0 standard drinks    Comment: occasionally  . Drug use: Yes    Types: Marijuana    Comment: last used 1  1/2 weeks ago.   Social History   Socioeconomic History  . Marital status: Single    Spouse name: Not on file  . Number of children: Not on file  . Years of education: Not on file  . Highest education level: Not on file  Occupational History  . Not on file  Tobacco Use  . Smoking status: Never Smoker  . Smokeless tobacco: Never Used  Vaping Use  . Vaping Use: Former  Substance and Sexual Activity  . Alcohol use: Yes    Alcohol/week: 0.0 standard drinks    Comment:  occasionally  . Drug use: Yes    Types: Marijuana    Comment: last used 1 1/2 weeks ago.  Marland Kitchen Sexual activity: Not on file  Other Topics Concern  . Not on file  Social History Narrative  . Not on file   Social Determinants of Health   Financial Resource Strain:   . Difficulty of Paying Living Expenses:   Food Insecurity:   . Worried About Programme researcher, broadcasting/film/video in the Last Year:   . Barista in the Last Year:   Transportation Needs:   . Freight forwarder (Medical):   Marland Kitchen Lack of Transportation (Non-Medical):   Physical Activity:   . Days of Exercise per Week:   . Minutes of Exercise per Session:   Stress:   . Feeling of Stress :   Social Connections:   . Frequency of Communication with Friends and Family:   . Frequency of Social Gatherings with Friends and Family:   . Attends Religious Services:   . Active Member of Clubs or Organizations:   . Attends Banker Meetings:   Marland Kitchen Marital Status:   Intimate Partner Violence:   . Fear of Current or Ex-Partner:   . Emotionally Abused:   Marland Kitchen Physically Abused:   . Sexually Abused:    Family Status  Relation Name Status  . Mother  Alive  . Father  Alive  . Sister  Alive  . Sister  Alive   Family History  Problem Relation Age of Onset  . Hyperlipidemia Mother   . Hypertension Father   . Asthma Sister    Allergies  Allergen Reactions  . Biaxin [Clarithromycin] Other (See Comments)    Happen when was a child; has been able to take Z Pak      Medications: Outpatient Medications Prior to Visit  Medication Sig  . albuterol (VENTOLIN HFA) 108 (90 Base) MCG/ACT inhaler INHALE 1 TO 2 PUFFS INTO THE LUNGS EVERY 6 HOURS AS NEEDED FOR WHEEZING OR SHORTNESS OF BREATH  . budesonide-formoterol (SYMBICORT) 80-4.5 MCG/ACT inhaler Inhale 2 puffs into the lungs 2 (two) times daily.  . Multiple Vitamin (MULTIVITAMIN WITH MINERALS) TABS tablet Take 1 tablet by mouth daily.  . sertraline (ZOLOFT) 100 MG tablet Take 1  tablet (100 mg total) by mouth daily.  Marland Kitchen ALPRAZolam (XANAX) 0.5 MG tablet Take 1 tablet (0.5 mg total) by mouth 3 (three) times daily as needed for anxiety. (Patient not taking: Reported on 02/18/2020)  . montelukast (SINGULAIR) 10 MG tablet Take 1 tablet (10 mg total) by mouth daily. (Patient not taking: Reported on 02/18/2020)   No facility-administered medications prior to visit.    Review of Systems  Constitutional: Positive for fatigue. Negative for activity change, appetite change, chills, diaphoresis, fever and unexpected weight change.  HENT: Positive for congestion, postnasal drip and sore throat. Negative for sinus pressure, sinus pain, sneezing and trouble swallowing.  Respiratory: Positive for shortness of breath and wheezing. Negative for apnea, cough, choking, chest tightness and stridor.   Cardiovascular: Negative for chest pain, palpitations and leg swelling.  Gastrointestinal: Negative.   Genitourinary: Negative.   Musculoskeletal: Negative.   Skin: Negative.   Neurological: Negative.   Hematological: Negative.   Psychiatric/Behavioral: Negative.     Last CBC Lab Results  Component Value Date   WBC 7.0 03/19/2018   HGB 16.4 03/19/2018   HCT 47.5 03/19/2018   MCV 85.9 03/19/2018   MCH 29.7 03/19/2018   RDW 12.5 03/19/2018   PLT 301 03/19/2018   Last metabolic panel Lab Results  Component Value Date   GLUCOSE 89 03/19/2018   NA 140 03/19/2018   K 4.6 03/19/2018   CL 104 03/19/2018   CO2 28 03/19/2018   BUN 16 03/19/2018   CREATININE 0.98 03/19/2018   GFRNONAA >60 03/19/2018   GFRAA >60 03/19/2018   CALCIUM 9.2 03/19/2018   ANIONGAP 8 03/19/2018      Objective    There were no vitals taken for this visit. BP Readings from Last 3 Encounters:  05/08/19 (!) 150/65  10/22/18 126/82  08/14/18 (!) 147/90      Physical Exam  Video not working properly and had to switch to telephone visit.   Patient is alert and oriented and responsive to questions  Engages in conversation with provider. Speaks in full sentences without any pauses without any shortness of breath or distress.   He does have a croupy  Sounding cough and inhalation at times. Mild wheezing occasionally.. No stridor or distress.    Assessment & Plan     1. Bronchitis with asthma, acute - amoxicillin-clavulanate (AUGMENTIN) 875-125 MG tablet; Take 1 tablet by mouth 2 (two) times daily.  Dispense: 20 tablet; Refill: 0 - predniSONE (DELTASONE) 10 MG tablet; Take 6 tablets (60 mg total) by mouth daily with breakfast for 2 days, THEN 5 tablets (50 mg total) daily with breakfast for 2 days, THEN 4 tablets (40 mg total) daily with breakfast for 2 days, THEN 3 tablets (30 mg total) daily with breakfast for 2 days, THEN 2 tablets (20 mg total) daily with breakfast for 2 days, THEN 1 tablet (10 mg total) daily with breakfast for 2 days.  Dispense: 42 tablet; Refill: 0  2. Class 3 severe obesity due to excess calories without serious comorbidity with body mass index (BMI) of 40.0 to 44.9 in adult (HCC)  3. Moderate persistent asthmatic bronchitis with acute exacerbation - amoxicillin-clavulanate (AUGMENTIN) 875-125 MG tablet; Take 1 tablet by mouth 2 (two) times daily.  Dispense: 20 tablet; Refill: 0 - predniSONE (DELTASONE) 10 MG tablet; Take 6 tablets (60 mg total) by mouth daily with breakfast for 2 days, THEN 5 tablets (50 mg total) daily with breakfast for 2 days, THEN 4 tablets (40 mg total) daily with breakfast for 2 days, THEN 3 tablets (30 mg total) daily with breakfast for 2 days, THEN 2 tablets (20 mg total) daily with breakfast for 2 days, THEN 1 tablet (10 mg total) daily with breakfast for 2 days.  Dispense: 42 tablet; Refill: 0  4. Suspected COVID-19 virus infection  5. Upper respiratory tract infection, unspecified type - amoxicillin-clavulanate (AUGMENTIN) 875-125 MG tablet; Take 1 tablet by mouth 2 (two) times daily.  Dispense: 20 tablet; Refill: 0 - predniSONE  (DELTASONE) 10 MG tablet; Take 6 tablets (60 mg total) by mouth daily with breakfast for 2 days, THEN 5 tablets (50 mg total) daily with  breakfast for 2 days, THEN 4 tablets (40 mg total) daily with breakfast for 2 days, THEN 3 tablets (30 mg total) daily with breakfast for 2 days, THEN 2 tablets (20 mg total) daily with breakfast for 2 days, THEN 1 tablet (10 mg total) daily with breakfast for 2 days.  Dispense: 42 tablet; Refill: 0 - benzonatate (TESSALON) 100 MG capsule; Take 1 capsule (100 mg total) by mouth 3 (three) times daily as needed for cough.  Dispense: 20 capsule; Refill: 0  6. Cough - amoxicillin-clavulanate (AUGMENTIN) 875-125 MG tablet; Take 1 tablet by mouth 2 (two) times daily.  Dispense: 20 tablet; Refill: 0 - predniSONE (DELTASONE) 10 MG tablet; Take 6 tablets (60 mg total) by mouth daily with breakfast for 2 days, THEN 5 tablets (50 mg total) daily with breakfast for 2 days, THEN 4 tablets (40 mg total) daily with breakfast for 2 days, THEN 3 tablets (30 mg total) daily with breakfast for 2 days, THEN 2 tablets (20 mg total) daily with breakfast for 2 days, THEN 1 tablet (10 mg total) daily with breakfast for 2 days.  Dispense: 42 tablet; Refill: 0 - benzonatate (TESSALON) 100 MG capsule; Take 1 capsule (100 mg total) by mouth 3 (three) times daily as needed for cough.  Dispense: 20 capsule; Refill: 0  [facility]  patient declined.  Discussed Viral versus bacterial , and likely viral infection given duration, however given patient's recent  Treatment in July 2021 will cover for bacterial infection.   Recommend hydration, and rest. Continue using inhalers as prescribed.  Seek care in person  immediately if any symptoms worsen or change at anytime.  Provider does recommend repeating Covid testing since he was tested early on not even 24 hours of symptom. Testing information in the area given.  He agrees to testing again.   Original covid test still pending  should be back today.  Return in about 1 week (around 03/19/2020), or if symptoms worsen or fail to improve, for at any time for any worsening symptoms, Go to Emergency room/ urgent care if worse.     I discussed the assessment and treatment plan with the patient. The patient was provided an opportunity to ask questions and all were answered. The patient agreed with the plan and demonstrated an understanding of the instructions.   The patient was advised to call back or seek an in-person evaluation if the symptoms worsen or if the condition fails to improve as anticipated.  I provided  20 minutes of non-face-to-face time during this encounter.  IBeverely Pace, Shyler Hamill Smith Kani Chauvin, FNP, have reviewed all documentation for this visit. The documentation on 03/12/20 for the exam, diagnosis, procedures, and orders are all accurate and complete.   I discussed the limitations of evaluation and management by telemedicine and the availability of in person appointments. The patient expressed understanding and agreed to proceed.   Jairo BenMichelle Smith Zvi Duplantis, FNP [facility] [phone] (phone) [phone] (fax)  [facility]

## 2020-03-12 NOTE — Telephone Encounter (Signed)
Someone from [facility] scheduled pt for office visit on Monday. Called pt to let him know we are not seeing patient with his symptoms in the office. Scheduled pt at 3:00 today with Marvell Fuller for [employer]. Patient was okay with doing mychart visit.

## 2020-03-15 ENCOUNTER — Ambulatory Visit: Payer: 59 | Admitting: Family Medicine

## 2020-05-06 ENCOUNTER — Ambulatory Visit: Payer: Self-pay | Admitting: Podiatry

## 2020-08-11 ENCOUNTER — Other Ambulatory Visit: Payer: Self-pay | Admitting: Family Medicine

## 2020-08-11 DIAGNOSIS — J4520 Mild intermittent asthma, uncomplicated: Secondary | ICD-10-CM

## 2020-08-11 MED ORDER — ALBUTEROL SULFATE HFA 108 (90 BASE) MCG/ACT IN AERS: INHALATION_SPRAY | RESPIRATORY_TRACT | 0 refills | Status: DC

## 2020-08-11 NOTE — Telephone Encounter (Signed)
Medication Refill - Medication: albuterol (VENTOLIN HFA) 108 (90 Base) MCG/ACT inhaler   Has the patient contacted their pharmacy? yes (Agent: If no, request that the patient contact the pharmacy for the refill.) (Agent: If yes, when and what did the pharmacy advise?)Contact PCP  Preferred Pharmacy (with phone number or street name): [id number] [place] Otto, [place] - [address] AT [facility] [address] Phone:  [phone]  Fax:  [phone]        Agent: Please be advised that RX refills may take up to 3 business days. We ask that you follow-up with your pharmacy.

## 2020-08-12 ENCOUNTER — Telehealth (INDEPENDENT_AMBULATORY_CARE_PROVIDER_SITE_OTHER): Payer: 59 | Admitting: Family Medicine

## 2020-08-12 ENCOUNTER — Other Ambulatory Visit: Payer: Self-pay

## 2020-08-12 ENCOUNTER — Encounter: Payer: Self-pay | Admitting: Family Medicine

## 2020-08-12 DIAGNOSIS — J209 Acute bronchitis, unspecified: Secondary | ICD-10-CM | POA: Diagnosis not present

## 2020-08-12 DIAGNOSIS — Z20822 Contact with and (suspected) exposure to covid-19: Secondary | ICD-10-CM | POA: Diagnosis not present

## 2020-08-12 DIAGNOSIS — J45909 Unspecified asthma, uncomplicated: Secondary | ICD-10-CM

## 2020-08-12 MED ORDER — PREDNISONE 10 MG PO TABS: ORAL_TABLET | ORAL | 0 refills | Status: DC

## 2020-08-12 NOTE — Progress Notes (Signed)
Telephone Visit    Virtual Visit via Telephonic Note   This visit type was conducted due to national recommendations for restrictions regarding the COVID-19 Pandemic (e.g. social distancing) in an effort to limit this patient's exposure and mitigate transmission in our community. This patient is at least at moderate risk for complications without adequate follow up. This format is felt to be most appropriate for this patient at this time. Physical exam was limited by quality of the audio technology used for the visit.   Patient location: Home Provider location: Office  I discussed the limitations of evaluation and management by telemedicine and the availability of in person appointments. The patient expressed understanding and agreed to proceed.  Patient: Cory Warren   DOB: 06/28/1991   30 y.o. Male  MRN: [id number] Visit Date: 08/12/2020  Today's healthcare provider: Dortha Kern, PA-C   No chief complaint on file.  Subjective    HPI  The patient is a 30 year old male who presents via video visit for symptoms consistent with Covid.  He states he and his wife began having symptoms on 08/05/20.  His wife tested positive on 08/10/20.  His results are not back yet.   He has been using inhalers, Ibuprofen, Monolukast, Zinc and Vitamins E and C to treat symptoms.    Past Medical History:  Diagnosis Date  . Asthma   . Hypertension    Past Surgical History:  Procedure Laterality Date  . KNEE SURGERY     Family History  Problem Relation Age of Onset  . Hyperlipidemia Mother   . Hypertension Father   . Asthma Sister    Social History   Tobacco Use  . Smoking status: Never Smoker  . Smokeless tobacco: Never Used  Vaping Use  . Vaping Use: Former  Substance Use Topics  . Alcohol use: Yes    Alcohol/week: 0.0 standard drinks    Comment: occasionally  . Drug use: Yes    Types: Marijuana    Comment: last used 1 1/2 weeks ago.   Allergies  Allergen Reactions  .  Biaxin [Clarithromycin] Other (See Comments)    Happen when was a child; has been able to take Z Pak     Medications: Outpatient Medications Prior to Visit  Medication Sig  . albuterol (VENTOLIN HFA) 108 (90 Base) MCG/ACT inhaler INHALE 1 TO 2 PUFFS INTO THE LUNGS EVERY 6 HOURS AS NEEDED FOR WHEEZING OR SHORTNESS OF BREATH  . ALPRAZolam (XANAX) 0.5 MG tablet Take 1 tablet (0.5 mg total) by mouth 3 (three) times daily as needed for anxiety. (Patient not taking: Reported on 02/18/2020)  . amoxicillin-clavulanate (AUGMENTIN) 875-125 MG tablet Take 1 tablet by mouth 2 (two) times daily.  . benzonatate (TESSALON) 100 MG capsule Take 1 capsule (100 mg total) by mouth 3 (three) times daily as needed for cough.  . budesonide-formoterol (SYMBICORT) 80-4.5 MCG/ACT inhaler Inhale 2 puffs into the lungs 2 (two) times daily.  . montelukast (SINGULAIR) 10 MG tablet Take 1 tablet (10 mg total) by mouth daily. (Patient not taking: Reported on 02/18/2020)  . Multiple Vitamin (MULTIVITAMIN WITH MINERALS) TABS tablet Take 1 tablet by mouth daily.  . sertraline (ZOLOFT) 100 MG tablet Take 1 tablet (100 mg total) by mouth daily.   No facility-administered medications prior to visit.    Review of Systems  Constitutional: Positive for chills and fatigue. Negative for fever.  HENT: Positive for congestion and sore throat. Negative for ear discharge, ear pain, postnasal drip,  rhinorrhea, sinus pressure, sinus pain and sneezing.   Respiratory: Positive for cough (productive) and chest tightness. Negative for shortness of breath and wheezing.   Gastrointestinal: Negative for abdominal pain, constipation, diarrhea, nausea and vomiting.  Neurological: Positive for headaches. Negative for dizziness.      Objective    There were no vitals taken for this visit.  During telephonic interview, some wheezing and cough audible.     Assessment & Plan     1. Bronchitis with asthma, acute Having some cough, wheeze,  headache and fatigue sine 08-04-20. No fever but some flare of asthma. Using albuterol prn with cough and congestion medications. Will give prednisone taper and started a COVID test yesterday. Maintain COVID restrictions. - predniSONE (DELTASONE) 10 MG tablet; Taper down by 1 tablet by mouth daily starting at 6 day 1, then, 5 day 2, 4 day 3, 3 day 4, 2 day 5 and 1 day 6. Divide tablets among meals and bedtime each day.  Dispense: 21 tablet; Refill: 0  2. Suspected COVID-19 virus infection Wife having similar symptoms and a positive COVID test the past weekend. He started his COVID test yesterday. With history of asthma and recent flare, may be a candidate for MAB infusion screening.   No follow-ups on file.     I discussed the assessment and treatment plan with the patient. The patient was provided an opportunity to ask questions and all were answered. The patient agreed with the plan and demonstrated an understanding of the instructions.   The patient was advised to call back or seek an in-person evaluation if the symptoms worsen or if the condition fails to improve as anticipated.  I provided 20 minutes of non-face-to-face time during this encounter.  I, Klark Vanderhoef, PA-C, have reviewed all documentation for this visit. The documentation on 08/12/20 for the exam, diagnosis, procedures, and orders are all accurate and complete.   Dortha Kern, PA-C [employer] [phone] (phone) [phone] (fax)  [facility]

## 2020-09-21 ENCOUNTER — Other Ambulatory Visit: Payer: Self-pay | Admitting: Family Medicine

## 2020-09-21 DIAGNOSIS — J4520 Mild intermittent asthma, uncomplicated: Secondary | ICD-10-CM

## 2020-11-17 ENCOUNTER — Other Ambulatory Visit: Payer: Self-pay | Admitting: Family Medicine

## 2020-11-17 DIAGNOSIS — J4520 Mild intermittent asthma, uncomplicated: Secondary | ICD-10-CM

## 2020-11-17 NOTE — Telephone Encounter (Signed)
Requested medication (s) are due for refill today:  no  Requested medication (s) are on the active medication list:  yes  Last refill:  10/27/2020  Future visit scheduled: no  Notes to clinic:  One inhaler should last at least one month. If the patient is requesting refills earlier, contact the patient to check for uncontrolled symptoms   Requested Prescriptions  Pending Prescriptions Disp Refills   albuterol (VENTOLIN HFA) 108 (90 Base) MCG/ACT inhaler [Pharmacy Med Name: ALBUTEROL HFA INH (200 PUFFS)8.5GM] 51 g     Sig: INHALE 1 TO 2 PUFFS INTO THE LUNGS EVERY 6 HOURS AS NEEDED FOR WHEEZING OR SHORTNESS OF BREATH      Pulmonology:  [facility] Failed - 11/17/2020 11:52 AM      Failed - One inhaler should last at least one month. If the patient is requesting refills earlier, contact the patient to check for uncontrolled symptoms.      Passed - Valid encounter within last 12 months    Recent Outpatient Visits           3 months ago Bronchitis with asthma, acute   [facility] Chrismon, Jodell Cipro, PA-C   8 months ago Bronchitis with asthma, acute   [employer], Eula Fried, FNP   9 months ago Intermittent asthma without complication, unspecified asthma severity   [facility] Maple Hudson., MD   1 year ago Moderate persistent asthmatic bronchitis with acute exacerbation   [facility] Maple Hudson., MD   2 years ago Viral upper respiratory tract infection   [facility] Maple Hudson., MD

## 2021-04-24 ENCOUNTER — Other Ambulatory Visit: Payer: Self-pay | Admitting: Family Medicine

## 2021-04-24 DIAGNOSIS — J4520 Mild intermittent asthma, uncomplicated: Secondary | ICD-10-CM

## 2021-04-25 NOTE — Telephone Encounter (Signed)
LOV: 08/12/2020 Maurine Minister chrismon, PA)  Next OV: none scheduled  Last refill: 02/18/2020 (Dr. Sullivan Lone)  Please advise. Thanks!

## 2021-06-21 ENCOUNTER — Ambulatory Visit (INDEPENDENT_AMBULATORY_CARE_PROVIDER_SITE_OTHER): Payer: 59 | Admitting: Family Medicine

## 2021-06-21 DIAGNOSIS — J4520 Mild intermittent asthma, uncomplicated: Secondary | ICD-10-CM

## 2021-06-21 DIAGNOSIS — J020 Streptococcal pharyngitis: Secondary | ICD-10-CM | POA: Diagnosis not present

## 2021-06-21 MED ORDER — ALBUTEROL SULFATE HFA 108 (90 BASE) MCG/ACT IN AERS
INHALATION_SPRAY | RESPIRATORY_TRACT | 5 refills | Status: DC
Start: 2021-06-21 — End: 2022-01-30

## 2021-06-21 MED ORDER — AMOXICILLIN 875 MG PO TABS: 875.0000 mg | ORAL_TABLET | Freq: Two times a day (BID) | ORAL | 0 refills | Status: AC

## 2021-06-21 MED ORDER — BUDESONIDE-FORMOTEROL FUMARATE 80-4.5 MCG/ACT IN AERO: 2.0000 | INHALATION_SPRAY | Freq: Two times a day (BID) | RESPIRATORY_TRACT | 5 refills | Status: DC

## 2021-06-21 NOTE — Progress Notes (Signed)
MyChart Video Visit    Virtual Visit via Video Note   This visit type was conducted due to national recommendations for restrictions regarding the COVID-19 Pandemic (e.g. social distancing) in an effort to limit this patient's exposure and mitigate transmission in our community. This patient is at least at moderate risk for complications without adequate follow up. This format is felt to be most appropriate for this patient at this time. Physical exam was limited by quality of the video and audio technology used for the visit.   Patient location: [facility] location: Office  I discussed the limitations of evaluation and management by telemedicine and the availability of in person appointments. The patient expressed understanding and agreed to proceed.  Patient: Cory Warren   DOB: 1990/12/18   30 y.o. Male  MRN: [id number] Visit Date: 06/21/2021  Today's healthcare provider: Megan Mans, MD   No chief complaint on file.  Subjective    HPI  Patient identified in telemedicine consult done. He has had 2 days of sore throat which is not getting any better and 1 day of headaches and body aches.  He had a negative COVID test earlier today.  He has had no cough and no shortness of breath.  He has a history of asthma and is on Symbicort and albuterol and needs refills.  He is moving to [facility] later this week. He also describes his neck being sore anteriorly with sore lymph nodes.  Medications: Outpatient Medications Prior to Visit  Medication Sig   albuterol (VENTOLIN HFA) 108 (90 Base) MCG/ACT inhaler INHALE 1 TO 2 PUFFS INTO THE LUNGS EVERY 6 HOURS AS NEEDED FOR WHEEZING OR SHORTNESS OF BREATH   ALPRAZolam (XANAX) 0.5 MG tablet Take 1 tablet (0.5 mg total) by mouth 3 (three) times daily as needed for anxiety. (Patient not taking: Reported on 02/18/2020)   amoxicillin-clavulanate (AUGMENTIN) 875-125 MG tablet Take 1 tablet by mouth 2 (two) times daily.   benzonatate  (TESSALON) 100 MG capsule Take 1 capsule (100 mg total) by mouth 3 (three) times daily as needed for cough.   montelukast (SINGULAIR) 10 MG tablet Take 1 tablet (10 mg total) by mouth daily. (Patient not taking: Reported on 02/18/2020)   Multiple Vitamin (MULTIVITAMIN WITH MINERALS) TABS tablet Take 1 tablet by mouth daily.   predniSONE (DELTASONE) 10 MG tablet Taper down by 1 tablet by mouth daily starting at 6 day 1, then, 5 day 2, 4 day 3, 3 day 4, 2 day 5 and 1 day 6. Divide tablets among meals and bedtime each day.   sertraline (ZOLOFT) 100 MG tablet Take 1 tablet (100 mg total) by mouth daily.   SYMBICORT 80-4.5 MCG/ACT inhaler INHALE 2 PUFFS INTO THE LUNGS TWICE DAILY   No facility-administered medications prior to visit.    Review of Systems  Constitutional:  Negative for appetite change, chills and fever.  HENT:  Positive for congestion and sore throat.   Respiratory:  Negative for chest tightness, shortness of breath and wheezing.   Cardiovascular:  Negative for chest pain and palpitations.  Gastrointestinal:  Negative for abdominal pain, nausea and vomiting.  Neurological:  Positive for headaches.      Objective    There were no vitals taken for this visit. BP Readings from Last 3 Encounters:  05/08/19 (!) 150/65  10/22/18 126/82  08/14/18 (!) 147/90   Wt Readings from Last 3 Encounters:  01/22/20 300 lb (136.1 kg)  05/08/19 290 lb (131.5 kg)  10/22/18 Marland Kitchen)  335 lb (152 kg)      Physical Exam     Assessment & Plan     1. Intermittent asthma without complication, unspecified asthma severity Refill inhalers.  I will see him back later in 2023 - albuterol (VENTOLIN HFA) 108 (90 Base) MCG/ACT inhaler; 1-2 puffs q 4 hrs prn  Dispense: 51 g; Refill: 5 - budesonide-formoterol (SYMBICORT) 80-4.5 MCG/ACT inhaler; Inhale 2 puffs into the lungs 2 (two) times daily.  Dispense: 10.2 g; Refill: 5  2. Strep throat Treated strep throat with amoxicillin for 10 days.   No  follow-ups on file.     I discussed the assessment and treatment plan with the patient. The patient was provided an opportunity to ask questions and all were answered. The patient agreed with the plan and demonstrated an understanding of the instructions.   The patient was advised to call back or seek an in-person evaluation if the symptoms worsen or if the condition fails to improve as anticipated.  I provided 12  minutes of non-face-to-face time during this encounter.  I, Megan Mans, MD, have reviewed all documentation for this visit. The documentation on 06/21/21 for the exam, diagnosis, procedures, and orders are all accurate and complete.   Truett Mcfarlan Wendelyn Breslow, MD [facility] [phone] (phone) [phone] (fax)  [facility]

## 2022-01-27 NOTE — Progress Notes (Signed)
Established patient visit  I,April Miller,acting as a scribe for Megan Mans, MD.,have documented all relevant documentation on the behalf of Megan Mans, MD,as directed by  Megan Mans, MD while in the presence of Megan Mans, MD.   Patient: Cory Warren   DOB: 02-Jan-1991   30 y.o. Male  MRN: [id number] Visit Date: 01/30/2022  Today's healthcare provider: Megan Mans, MD   Chief Complaint  Patient presents with   Follow-up   Subjective    HPI  Patient has been having pain in his left shoulder for around one week. Patient states pain will radiate to underarm, and sometimes down the left arm. Patient also states pain around left breast area. Patient also has low back pain.  No known trauma.  Patient was seen in the ED for this and had some beginning of the work-up but was concerned about the cost so he left.  He is having no exertional chest pain or shortness of breath and no syncope or presyncope.  No neurologic symptoms.  Medications: Outpatient Medications Prior to Visit  Medication Sig   [DISCONTINUED] albuterol (VENTOLIN HFA) 108 (90 Base) MCG/ACT inhaler 1-2 puffs q 4 hrs prn   montelukast (SINGULAIR) 10 MG tablet Take 1 tablet (10 mg total) by mouth daily. (Patient not taking: Reported on 02/18/2020)   Multiple Vitamin (MULTIVITAMIN WITH MINERALS) TABS tablet Take 1 tablet by mouth daily.   sertraline (ZOLOFT) 100 MG tablet Take 1 tablet (100 mg total) by mouth daily.   [DISCONTINUED] ALPRAZolam (XANAX) 0.5 MG tablet Take 1 tablet (0.5 mg total) by mouth 3 (three) times daily as needed for anxiety. (Patient not taking: Reported on 02/18/2020)   [DISCONTINUED] amoxicillin-clavulanate (AUGMENTIN) 875-125 MG tablet Take 1 tablet by mouth 2 (two) times daily.   [DISCONTINUED] benzonatate (TESSALON) 100 MG capsule Take 1 capsule (100 mg total) by mouth 3 (three) times daily as needed for cough.   [DISCONTINUED] budesonide-formoterol  (SYMBICORT) 80-4.5 MCG/ACT inhaler Inhale 2 puffs into the lungs 2 (two) times daily. (Patient not taking: Reported on 01/30/2022)   [DISCONTINUED] predniSONE (DELTASONE) 10 MG tablet Taper down by 1 tablet by mouth daily starting at 6 day 1, then, 5 day 2, 4 day 3, 3 day 4, 2 day 5 and 1 day 6. Divide tablets among meals and bedtime each day.   No facility-administered medications prior to visit.    Review of Systems  Constitutional:  Negative for appetite change, chills and fever.  Respiratory:  Negative for chest tightness, shortness of breath and wheezing.   Cardiovascular:  Negative for chest pain and palpitations.  Gastrointestinal:  Negative for abdominal pain, nausea and vomiting.       Objective    BP 140/90 (BP Location: Left Arm, Patient Position: Sitting, Cuff Size: Large)   Pulse 91   Resp 16   Ht 5\' 10"  (1.778 m)   Wt (!) 318 lb (144.2 kg)   SpO2 96%   BMI 45.63 kg/m    Physical Exam Vitals reviewed.  Constitutional:      Appearance: He is well-developed. He is obese.     Comments: Obes WM NAD.  HENT:     Head: Normocephalic and atraumatic.     Right Ear: External ear normal.     Left Ear: External ear normal.     Nose: Nose normal.  Eyes:     General: No scleral icterus.    Conjunctiva/sclera: Conjunctivae normal.  Neck:  Thyroid: No thyromegaly.  Cardiovascular:     Rate and Rhythm: Normal rate and regular rhythm.     Heart sounds: Normal heart sounds.  Pulmonary:     Effort: Pulmonary effort is normal.     Breath sounds: Normal breath sounds.  Abdominal:     Palpations: Abdomen is soft.  Musculoskeletal:        General: No swelling.     Comments: He has minimal tenderness along the posterior shoulder but specifically along the top of the left scapula  Skin:    General: Skin is warm and dry.  Neurological:     General: No focal deficit present.     Mental Status: He is alert and oriented to person, place, and time. Mental status is at  baseline.  Psychiatric:        Behavior: Behavior normal.        Thought Content: Thought content normal.        Judgment: Judgment normal.         01/30/2022    3:46 PM 07/11/2018    4:33 PM 08/03/2017    9:54 AM  Depression screen PHQ 2/9  Decreased Interest 0 0 0  Down, Depressed, Hopeless 0 0 0  PHQ - 2 Score 0 0 0  Altered sleeping 0    Tired, decreased energy 0    Change in appetite 0    Feeling bad or failure about yourself  0    Trouble concentrating 0    Moving slowly or fidgety/restless 0    Suicidal thoughts 0    PHQ-9 Score 0    Difficult doing work/chores Not difficult at all        01/30/2022    3:47 PM  GAD 7 : Generalized Anxiety Score  Nervous, Anxious, on Edge 2  Control/stop worrying 1  Worry too much - different things 2  Trouble relaxing 1  Restless 0  Easily annoyed or irritable 2  Afraid - awful might happen 2  Total GAD 7 Score 10  Anxiety Difficulty Somewhat difficult     No results found for any visits on 01/30/22.  Assessment & Plan     1. Chest pain, unspecified type ED records are reviewed and the pain is very vague and nondescript. At this time treat with prednisone for 5 days obtain chest x-ray and lab work and follow-up in a few weeks. Also at this time obtain baseline PHQ-9 and GAD-7 I think this might shed some light on the issue also. - Comprehensive metabolic panel - Sed Rate (ESR) - DG Chest 2 View; Future - predniSONE (DELTASONE) 20 MG tablet; Take 1 tablet (20 mg total) by mouth daily with breakfast.  Dispense: 5 tablet; Refill: 0  2. Left shoulder pain, unspecified chronicity  - Sed Rate (ESR) - predniSONE (DELTASONE) 20 MG tablet; Take 1 tablet (20 mg total) by mouth daily with breakfast.  Dispense: 5 tablet; Refill: 0  3. Uncomplicated asthma, unspecified asthma severity, unspecified whether persistent  - Comprehensive metabolic panel - DG Chest 2 View; Future  4. Intermittent asthma without complication,  unspecified asthma severity  - budesonide-formoterol (SYMBICORT) 80-4.5 MCG/ACT inhaler; Inhale 2 puffs into the lungs 2 (two) times daily.  Dispense: 10.2 g; Refill: 5 - albuterol (VENTOLIN HFA) 108 (90 Base) MCG/ACT inhaler; 1-2 puffs q 4 hrs prn  Dispense: 51 g; Refill: 5 5.  Morbid obesity Diet and exercise and weight loss discussed with patient and wife present.  I Minna follow-up with  him in 3 to 4 weeks.  Return in about 23 days (around 02/22/2022).      I, Megan Mans, MD, have reviewed all documentation for this visit. The documentation on 01/31/22 for the exam, diagnosis, procedures, and orders are all accurate and complete.    Sahar Ryback Wendelyn Breslow, MD  [facility] [phone] (phone) [phone] (fax)  [facility]

## 2022-01-30 ENCOUNTER — Ambulatory Visit (INDEPENDENT_AMBULATORY_CARE_PROVIDER_SITE_OTHER): Payer: 59 | Admitting: Family Medicine

## 2022-01-30 ENCOUNTER — Ambulatory Visit
Admission: RE | Admit: 2022-01-30 | Discharge: 2022-01-30 | Disposition: A | Discharge status: Home / Self Care | Payer: 59 | Attending: Family Medicine | Admitting: Family Medicine

## 2022-01-30 ENCOUNTER — Ambulatory Visit
Admission: RE | Admit: 2022-01-30 | Discharge: 2022-01-30 | Disposition: A | Discharge status: Home / Self Care | Payer: 59 | Source: Ambulatory Visit | Attending: Family Medicine | Admitting: Family Medicine

## 2022-01-30 ENCOUNTER — Encounter: Payer: Self-pay | Admitting: Family Medicine

## 2022-01-30 VITALS — BP 140/90 | HR 91 | Resp 16 | Ht 70.0 in | Wt 318.0 lb

## 2022-01-30 DIAGNOSIS — J45909 Unspecified asthma, uncomplicated: Secondary | ICD-10-CM

## 2022-01-30 DIAGNOSIS — R079 Chest pain, unspecified: Secondary | ICD-10-CM | POA: Insufficient documentation

## 2022-01-30 DIAGNOSIS — J4520 Mild intermittent asthma, uncomplicated: Secondary | ICD-10-CM

## 2022-01-30 DIAGNOSIS — Z6841 Body Mass Index (BMI) 40.0 and over, adult: Secondary | ICD-10-CM

## 2022-01-30 DIAGNOSIS — M25512 Pain in left shoulder: Secondary | ICD-10-CM | POA: Diagnosis not present

## 2022-01-30 MED ORDER — PREDNISONE 20 MG PO TABS: 20.0000 mg | ORAL_TABLET | Freq: Every day | ORAL | 0 refills | Status: AC

## 2022-01-30 MED ORDER — BUDESONIDE-FORMOTEROL FUMARATE 80-4.5 MCG/ACT IN AERO: 2.0000 | INHALATION_SPRAY | Freq: Two times a day (BID) | RESPIRATORY_TRACT | 5 refills | Status: AC

## 2022-01-30 MED ORDER — ALBUTEROL SULFATE HFA 108 (90 BASE) MCG/ACT IN AERS: INHALATION_SPRAY | RESPIRATORY_TRACT | 5 refills | Status: AC

## 2022-01-31 LAB — SEDIMENTATION RATE: Sed Rate: 4 mm/hr (ref 0–15)

## 2022-01-31 LAB — COMPREHENSIVE METABOLIC PANEL
ALT: 36 IU/L (ref 0–44)
AST: 27 IU/L (ref 0–40)
Albumin/Globulin Ratio: 1.6 (ref 1.2–2.2)
Albumin: 4.5 g/dL (ref 4.1–5.2)
Alkaline Phosphatase: 98 IU/L (ref 44–121)
BUN/Creatinine Ratio: 14 (ref 9–20)
BUN: 16 mg/dL (ref 6–20)
Bilirubin Total: 0.3 mg/dL (ref 0.0–1.2)
CO2: 24 mmol/L (ref 20–29)
Calcium: 9.5 mg/dL (ref 8.7–10.2)
Chloride: 103 mmol/L (ref 96–106)
Creatinine, Ser: 1.14 mg/dL (ref 0.76–1.27)
Globulin, Total: 2.9 g/dL (ref 1.5–4.5)
Glucose: 87 mg/dL (ref 70–99)
Potassium: 4.7 mmol/L (ref 3.5–5.2)
Sodium: 141 mmol/L (ref 134–144)
Total Protein: 7.4 g/dL (ref 6.0–8.5)
eGFR: 90 mL/min/{1.73_m2} (ref >59)

## 2022-02-22 ENCOUNTER — Ambulatory Visit: Payer: 59 | Admitting: Family Medicine

## 2022-02-22 NOTE — Progress Notes (Deleted)
      Established patient visit   Patient: Cory Warren   DOB: May 05, 1991   31 y.o. Male  MRN: [id number] Visit Date: 02/22/2022  Today's healthcare provider: Megan Mans, MD   No chief complaint on file.  Subjective    HPI  Patient is here for 3 week follow up.   Medications: Outpatient Medications Prior to Visit  Medication Sig   albuterol (VENTOLIN HFA) 108 (90 Base) MCG/ACT inhaler 1-2 puffs q 4 hrs prn   budesonide-formoterol (SYMBICORT) 80-4.5 MCG/ACT inhaler Inhale 2 puffs into the lungs 2 (two) times daily.   montelukast (SINGULAIR) 10 MG tablet Take 1 tablet (10 mg total) by mouth daily. (Patient not taking: Reported on 02/18/2020)   Multiple Vitamin (MULTIVITAMIN WITH MINERALS) TABS tablet Take 1 tablet by mouth daily.   predniSONE (DELTASONE) 20 MG tablet Take 1 tablet (20 mg total) by mouth daily with breakfast.   sertraline (ZOLOFT) 100 MG tablet Take 1 tablet (100 mg total) by mouth daily.   No facility-administered medications prior to visit.    Review of Systems  Constitutional:  Negative for appetite change, chills and fever.  Respiratory:  Negative for chest tightness, shortness of breath and wheezing.   Cardiovascular:  Negative for chest pain and palpitations.  Gastrointestinal:  Negative for abdominal pain, nausea and vomiting.    {Labs  Heme  Chem  Endocrine  Serology  Results Review (optional):23779}   Objective    There were no vitals taken for this visit. {Show previous vital signs (optional):23777}  Physical Exam  ***  No results found for any visits on 02/22/22.  Assessment & Plan     ***  No follow-ups on file.      {provider attestation***:1}   Megan Mans, MD  [facility] [phone] (phone) [phone] (fax)  [facility]
